# Patient Record
Sex: Female | Born: 1985 | Race: Black or African American | Hispanic: No | Marital: Single | Smoking: Never smoker
Health system: Southern US, Community
[De-identification: ages and names within clinical notes are randomized; demographics above are authoritative.]

## PROBLEM LIST (undated history)

## (undated) ENCOUNTER — Inpatient Hospital Stay (HOSPITAL_COMMUNITY): Payer: Self-pay

## (undated) DIAGNOSIS — N200 Calculus of kidney: Secondary | ICD-10-CM

## (undated) DIAGNOSIS — F329 Major depressive disorder, single episode, unspecified: Secondary | ICD-10-CM

## (undated) DIAGNOSIS — F32A Depression, unspecified: Secondary | ICD-10-CM

## (undated) HISTORY — DX: Calculus of kidney: N20.0

## (undated) HISTORY — DX: Depression, unspecified: F32.A

## (undated) HISTORY — DX: Major depressive disorder, single episode, unspecified: F32.9

## (undated) HISTORY — PX: OTHER SURGICAL HISTORY: SHX169

---

## 2004-05-14 HISTORY — PX: FRACTURE SURGERY: SHX138

## 2005-12-22 ENCOUNTER — Inpatient Hospital Stay (HOSPITAL_COMMUNITY): Admission: AD | Admit: 2005-12-22 | Discharge: 2005-12-22 | Payer: Self-pay | Admitting: Family Medicine

## 2005-12-25 ENCOUNTER — Inpatient Hospital Stay (HOSPITAL_COMMUNITY): Admission: AD | Admit: 2005-12-25 | Discharge: 2005-12-25 | Payer: Self-pay | Admitting: Family Medicine

## 2005-12-27 ENCOUNTER — Other Ambulatory Visit: Admission: RE | Admit: 2005-12-27 | Discharge: 2005-12-27 | Payer: Self-pay | Admitting: Obstetrics and Gynecology

## 2006-01-17 ENCOUNTER — Inpatient Hospital Stay (HOSPITAL_COMMUNITY): Admission: AD | Admit: 2006-01-17 | Discharge: 2006-01-18 | Payer: Self-pay | Admitting: Obstetrics and Gynecology

## 2006-05-14 HISTORY — PX: LITHOTRIPSY: SUR834

## 2007-12-23 ENCOUNTER — Ambulatory Visit: Payer: Self-pay | Admitting: Gastroenterology

## 2008-05-14 LAB — CONVERTED CEMR LAB: Pap Smear: NORMAL

## 2008-05-14 LAB — HM PAP SMEAR

## 2008-11-18 ENCOUNTER — Ambulatory Visit: Payer: Self-pay | Admitting: Family Medicine

## 2008-11-18 DIAGNOSIS — F329 Major depressive disorder, single episode, unspecified: Secondary | ICD-10-CM | POA: Insufficient documentation

## 2008-11-18 DIAGNOSIS — G43009 Migraine without aura, not intractable, without status migrainosus: Secondary | ICD-10-CM | POA: Insufficient documentation

## 2008-11-22 ENCOUNTER — Telehealth: Payer: Self-pay | Admitting: Family Medicine

## 2008-11-30 ENCOUNTER — Ambulatory Visit (HOSPITAL_COMMUNITY): Payer: Self-pay | Admitting: Psychiatry

## 2008-12-02 ENCOUNTER — Telehealth: Payer: Self-pay | Admitting: Family Medicine

## 2008-12-03 ENCOUNTER — Ambulatory Visit (HOSPITAL_COMMUNITY): Payer: Self-pay | Admitting: Psychiatry

## 2008-12-20 ENCOUNTER — Ambulatory Visit (HOSPITAL_COMMUNITY): Payer: Self-pay | Admitting: Psychiatry

## 2008-12-31 ENCOUNTER — Ambulatory Visit (HOSPITAL_COMMUNITY): Payer: Self-pay | Admitting: Psychiatry

## 2009-01-13 ENCOUNTER — Ambulatory Visit (HOSPITAL_COMMUNITY): Payer: Self-pay | Admitting: Psychiatry

## 2009-02-04 ENCOUNTER — Ambulatory Visit: Payer: Self-pay | Admitting: Gastroenterology

## 2009-03-03 ENCOUNTER — Inpatient Hospital Stay (HOSPITAL_COMMUNITY): Admission: AD | Admit: 2009-03-03 | Discharge: 2009-03-04 | Payer: Self-pay | Admitting: Obstetrics & Gynecology

## 2009-03-06 ENCOUNTER — Ambulatory Visit (HOSPITAL_COMMUNITY): Admission: AD | Admit: 2009-03-06 | Discharge: 2009-03-06 | Payer: Self-pay | Admitting: Obstetrics & Gynecology

## 2009-03-10 ENCOUNTER — Ambulatory Visit (HOSPITAL_COMMUNITY): Admission: RE | Admit: 2009-03-10 | Discharge: 2009-03-10 | Payer: Self-pay | Admitting: Obstetrics & Gynecology

## 2009-04-04 ENCOUNTER — Ambulatory Visit (HOSPITAL_COMMUNITY): Admission: RE | Admit: 2009-04-04 | Discharge: 2009-04-04 | Payer: Self-pay | Admitting: Obstetrics and Gynecology

## 2009-07-09 ENCOUNTER — Inpatient Hospital Stay (HOSPITAL_COMMUNITY): Admission: AD | Admit: 2009-07-09 | Discharge: 2009-07-10 | Payer: Self-pay | Admitting: Obstetrics and Gynecology

## 2009-10-19 ENCOUNTER — Inpatient Hospital Stay (HOSPITAL_COMMUNITY): Admission: AD | Admit: 2009-10-19 | Discharge: 2009-10-19 | Payer: Self-pay | Admitting: Obstetrics and Gynecology

## 2009-11-03 ENCOUNTER — Inpatient Hospital Stay (HOSPITAL_COMMUNITY): Admission: AD | Admit: 2009-11-03 | Discharge: 2009-11-06 | Payer: Self-pay | Admitting: Obstetrics and Gynecology

## 2009-11-06 ENCOUNTER — Encounter: Admission: RE | Admit: 2009-11-06 | Discharge: 2009-12-06 | Payer: Self-pay | Admitting: Obstetrics and Gynecology

## 2010-04-12 ENCOUNTER — Ambulatory Visit: Payer: Self-pay | Admitting: Family

## 2010-04-12 DIAGNOSIS — IMO0002 Reserved for concepts with insufficient information to code with codable children: Secondary | ICD-10-CM | POA: Insufficient documentation

## 2010-04-12 DIAGNOSIS — K439 Ventral hernia without obstruction or gangrene: Secondary | ICD-10-CM | POA: Insufficient documentation

## 2010-04-14 ENCOUNTER — Telehealth: Payer: Self-pay | Admitting: Family

## 2010-04-18 ENCOUNTER — Ambulatory Visit (HOSPITAL_COMMUNITY)
Admission: RE | Admit: 2010-04-18 | Discharge: 2010-04-18 | Payer: Self-pay | Source: Home / Self Care | Admitting: Obstetrics and Gynecology

## 2010-04-24 ENCOUNTER — Telehealth: Payer: Self-pay | Admitting: Family

## 2010-04-25 ENCOUNTER — Ambulatory Visit: Payer: Self-pay | Admitting: Family

## 2010-04-25 DIAGNOSIS — R635 Abnormal weight gain: Secondary | ICD-10-CM | POA: Insufficient documentation

## 2010-04-26 ENCOUNTER — Encounter: Payer: Self-pay | Admitting: Family

## 2010-05-03 ENCOUNTER — Ambulatory Visit: Payer: Self-pay | Admitting: Family

## 2010-05-05 ENCOUNTER — Telehealth: Payer: Self-pay | Admitting: Family

## 2010-05-09 ENCOUNTER — Ambulatory Visit: Payer: Self-pay | Admitting: Family

## 2010-05-19 ENCOUNTER — Ambulatory Visit: Admit: 2010-05-19 | Payer: Self-pay | Admitting: Family

## 2010-05-29 ENCOUNTER — Ambulatory Visit: Admit: 2010-05-29 | Payer: Self-pay | Admitting: Psychology

## 2010-05-30 ENCOUNTER — Ambulatory Visit: Admit: 2010-05-30 | Payer: Self-pay | Admitting: Family

## 2010-05-30 DIAGNOSIS — Z0289 Encounter for other administrative examinations: Secondary | ICD-10-CM

## 2010-06-06 ENCOUNTER — Ambulatory Visit: Admit: 2010-06-06 | Payer: Self-pay | Admitting: Family

## 2010-06-06 ENCOUNTER — Telehealth: Payer: Self-pay | Admitting: Family

## 2010-06-06 DIAGNOSIS — Z0289 Encounter for other administrative examinations: Secondary | ICD-10-CM

## 2010-06-09 ENCOUNTER — Ambulatory Visit: Admit: 2010-06-09 | Payer: Self-pay | Admitting: Family

## 2010-06-09 DIAGNOSIS — Z0289 Encounter for other administrative examinations: Secondary | ICD-10-CM

## 2010-06-13 NOTE — Assessment & Plan Note (Signed)
Summary: TO EST/ ABD PAIN/HEA--Rm 5   Vital Signs:  Patient profile:   25 year old female Menstrual status:  regular LMP:     03/14/2010 Height:      63.4 inches Weight:      215.75 pounds BMI:     37.87 Temp:     98.2 degrees F oral Pulse rate:   84 / minute Pulse rhythm:   regular Resp:     16 per minute BP sitting:   110 / 76  (right arm) Cuff size:   large  Vitals Entered By: Mervin Kung CMA Duncan Dull) (April 12, 2010 8:16 AM) CC: New pt to establish care.  Has had intermittent right side abdominal pain x 2 months. Noticed a lump on the right side of her abdomen 3 weeks ago.  Has history of depression and would like to restart meds., Depression Is Patient Diabetic? No Pain Assessment Patient in pain? no      Comments Pt stopped Prozac when pregnant. Would like to start med for depression. Nicki Guadalajara Fergerson CMA Duncan Dull)  April 12, 2010 8:24 AM  LMP (date): 03/14/2010     Enter LMP: 03/14/2010 Last PAP Result normal   Primary Care Provider:  Lemont Fillers FNP  CC:  New pt to establish care.  Has had intermittent right side abdominal pain x 2 months. Noticed a lump on the right side of her abdomen 3 weeks ago.  Has history of depression and would like to restart meds. and Depression.  History of Present Illness: Ms. Glidden is a 25 year old female who presents today to establish care.  She was previously followed by Dr. Linford Arnold at the [place] site. She has two concerns today:  1) Lump on right side of stomach for a few weeks.  More noticable with sitting or standing. + nausea, no vomitting.   Intermittent cramping pain "all over her stomch."  Attributes these symptoms to constipation.  Only has BM once every few weeks.  Has tried dulcolax, benefiber, increasing water without improvement.  Appetite is fair.  2) Depression-  Did not have any improvement with prozac.  Saw psychiatrist Junius Argyle) and was tried on a new medication but she stopped due pregnancy.   Now has a 15 month old daughter who is bottle fed. Feels that her symtpoms have worsened since the birth of her daughter.  + anxiety- sometimes can't breath, heart racing.  About twice a week.  +Symptoms are associated with Crying spells, but not associated with suicide ideation.  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 Denies suicidal ideation.  Has been told by her co-workers that her appearance "looks bad."  Has also overheard her supervisor saying that she "needs to keep her problems at home."  Reports that her performance at her call center is down, " I just sit there."   Depression History:      Positive alarm features for depression include significant weight gain, insomnia, fatigue (loss of energy), and impaired concentration (indecisiveness).  However, she denies hypersomnia.  The patient denies symptoms of a manic disorder including excessive buying sprees and excessive sexual indiscretions.        Comments:  depression worse since birth of her 48 month old child.  Falls asleep, then wakes back up.  .   Allergies (verified): No Known Drug Allergies  Past History:  Past Medical History: Hx kidney stones Depression [id number]  Family History: MGF with alcoholism, Paunts with alcoholism Dad-  living, alive and well Mom- living, alive and well Maternal GM- Breast cancer diagnosed in 25's, died at age 12. Aunt (maternal) 4 with breast cancer- 3 deceased, on living (one was diagnosed at age 26 1 Brother- alive and well (13 years younger) 2 sisters (one older, one younger) alive an well 1 daughter born 6/11- healthy  Social History: Science writer.  Single.  Broke up with daughter's dad. Never Smoked Alcohol use-no Drug use-no Regular exercise-yes  Review of Systems       Constitutional: Denies Fever ENT:  Denies nasal congestion or sore throat. Resp: Denies cough CV:  Denies Chest Pain GI:  mild  nausea no vomitting GU: Denies dysuria Lymphatic:  Denies lymphadenopathy Musculoskeletal:  + back pain, sees a chiropracter Skin:  Denies Rashes Psychiatric: see HPI Neuro: Denies numbness     Physical Exam  General:  Overweight AA female, awake, alert, tearful at times. Head:  Normocephalic and atraumatic without obvious abnormalities. No apparent alopecia or balding. Eyes:  PERRLA, sclera clear Ears:  External ear exam shows no significant lesions or deformities.  Otoscopic examination reveals clear canals, tympanic membranes are intact bilaterally without bulging, retraction, inflammation or discharge. Hearing is grossly normal bilaterally. Mouth:  Oral mucosa and oropharynx without lesions or exudates.  Teeth in good repair. Neck:  No deformities, masses, or tenderness noted. Lungs:  Normal respiratory effort, chest expands symmetrically. Lungs are clear to auscultation, no crackles or wheezes. Heart:  Normal rate and regular rhythm. S1 and S2 normal without gallop, murmur, click, rub or other extra sounds. Abdomen:  + protuberance noted on right abdominal wall beneath adipose tissue, easily reducible non-tender.  + hypoactive bowel sounds.  Abdomen is soft, no guarding.   Msk:  Bilateral UE/LE strength is 5/5 Neurologic:  No cranial nerve deficits noted. Station and gait are normal. Plantar reflexes are down-going bilaterally. DTRs are symmetrical throughout. Sensory, motor and coordinative functions appear intact. Psych:  Pleasant female, intermittenty tearful but appropriate.   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  Recommended that the patient start with 30 mg PO once daily and then increase to 60 mg once daily on the second week.  Patient was given rx for 30mg  and samples provided for cymbalta 60mg  04 2013 [id number] A #28.  Patient was counseled on the potential side effects  including risk of suicide ideation and instructed her to call us immediately if she develops worsening depression.  Pt verbalized understanding.  Pt is requesting that she be written out of work which I feel is appropriate at this time until her medication starts to help her.  Recommended f/u in 2 weeks so that we can re-assess her.  25 minutes spent with patient.  Greater than 50% of this time was spent counseling patient on her depression.   Problem # 2:  VENTRAL HERNIA (ICD-553.20) Assessment: New  5 month post partum female presents with reducible ventral hernia.  Will refer to surgeon for further evaluation.    Orders: Surgical Referral (Surgery)  Complete Medication List: 1)  Depo-provera 400 Mg/ml Susp (Medroxyprogesterone acetate) .... Per gyn 2)  Miralax Powd (Polyethylene glycol 3350) .Marland KitchenMarland KitchenMarland Kitchen 17 grams by mouth once daily in 8 ounces of water 3)  Cymbalta 30 Mg Cpep (Duloxetine hcl) .... One tablet by mouth once daily for 1 week, then increase to 60 mg by mouth daily  Patient Instructions: 1)  Cymbalta it will likely take several weeks before you will notice improvement. 2)  Side effects of this medicine may include drowsiness or nausea.  If this becomes an issue for you call for further instructions. 3)  Very rarely people may develop suicidal thoughts when taking these types of medicines- should this happen to you, discontinue medication and go directly to the emergency room. 4)  Please arrange a follow up appointment in 2 weeks. Prescriptions: CYMBALTA 60 MG CPEP (DULOXETINE HCL) one tablet by mouth once daily  #28 x 0   Entered and Authorized by:   Lemont Fillers FNP   Signed by:   Lemont Fillers FNP on 04/12/2010   Method used:   Samples Given   RxID:   [id number] CYMBALTA 30 MG CPEP (DULOXETINE HCL) one tablet by mouth once daily for 1 week, then increase to 60 mg by mouth daily  #7 x 0   Entered and Authorized by:   Lemont Fillers FNP   Signed by:    Lemont Fillers FNP on 04/12/2010   Method used:   Electronically to        [facility] [address] (retail)       [phone]       [place], [place]  [address]       Ph: [phone]       Fax: [phone]   RxID:   [id number]    Orders Added: 1)  Surgical Referral [Surgery] 2)  Est. Patient Level IV [09381]    Current Allergies (reviewed today): No known allergies     Preventive Care Screening  Pap Smear:    Date:  01/12/2010    Results:  normal

## 2010-06-13 NOTE — Letter (Signed)
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 [address]   [place], [place] [address]   Phone: [phone]  Fax: [phone]    April 12, 2010   Employee:  [name]    To Whom It May Concern:   For Medical reasons, please excuse the above named employee from work for the following dates:  Start:   04/12/10  End:   05/01/10  If you need additional information, please feel free to contact our office.         Sincerely,    Lemont Fillers FNP

## 2010-06-13 NOTE — Progress Notes (Signed)
Summary: Medical Leave paperwork release  Phone Note Outgoing Call   Call placed by: Mervin Kung, CMA (AAMA) Call placed to: Patient Summary of Call: Received Behavioral Health Clinician Statement from [facility] [employer]  to be completed for pt's leave of absence from work. Forms completed, pt notified and she will stop by office today to sign Medical Release for paperwork. Nicki Guadalajara Fergerson CMA Duncan Dull)  April 14, 2010 11:43 AM   Follow-up for Phone Call        Spoke to pt, states she did not have transportation on Friday but plans to come today to sign release. Nicki Guadalajara Fergerson CMA Duncan Dull)  April 17, 2010 9:18 AM   Additional Follow-up for Phone Call Additional follow up Details #1::        Pt signed record release. Form and notes faxed to [employer] at [phone]. Nicki Guadalajara Fergerson CMA Duncan Dull)  April 17, 2010 11:51 AM

## 2010-06-15 NOTE — Progress Notes (Signed)
Summary: r/s appt  Phone Note Outgoing Call   Call placed by: Vilma Prader [facility]) Call placed to: Patient Summary of Call: Left message for pt to return my call.  Pt missed appt this a.m. and needs to r/s.  Nicki Guadalajara Fergerson CMA Duncan Dull)  June 06, 2010 11:40 AM   Follow-up for Phone Call        Left message for pt to return my call. Nicki Guadalajara Fergerson CMA Duncan Dull)  June 07, 2010 8:44 AM   Pt returned my call and rescheduled for Friday 06/09/10 @ 8:15am. Mervin Kung CMA (AAMA)  June 07, 2010 4:24 PM

## 2010-06-15 NOTE — Assessment & Plan Note (Signed)
Summary: f/u to assess return to work / tf,cma--Rm 5   Vital Signs:  Patient profile:   25 year old female Menstrual status:  regular Height:      63.4 inches Weight:      218 pounds BMI:     38.27 Temp:     98.3 degrees F oral Pulse rate:   84 / minute Pulse rhythm:   regular Resp:     16 per minute BP sitting:   110 / 78  (right arm) Cuff size:   large  Vitals Entered By: Mervin Kung CMA Duncan Dull) (April 25, 2010 1:59 PM) CC: Pt here for reassessment of release to work. Is Patient Diabetic? No Comments Pt agrees all med doses and directions are correct. Nicki Guadalajara Fergerson CMA Duncan Dull)  April 25, 2010 2:05 PM    Primary Care Provider:  Lemont Fillers FNP  CC:  Pt here for reassessment of release to work..  History of Present Illness: Ms. [name] is a 25 year old female who presents today for follow up of her depression and for evaluation for return to work.    1) Depression- tolerating cymbalta without side effects except mild HA.  Now up to 60 mg.  Doesn' feel any better yet  in regards to her depression. Anxiety was a little better last week. Only had one anxiety episode last  week.  Continues having daily crying spells.  Not sleeping well.  Denies suicide ideation.  Appettite is poor.  Notes continued weight gain.  Has gained close to 100 pounds since 2007.     Allergies (verified): No Known Drug Allergies  Past History:  Past Medical History: Last updated: 04/12/2010 Hx kidney stones Depression [id number]  Past Surgical History: Last updated: 11/18/2008 Lithotripsy 2008 ankle fracture surg 2006  Review of Systems       see HPI  Physical Exam  General:  Well-developed,well-nourished,in no acute distress; alert,appropriate and cooperative throughout examination Psych:  Oriented X3, memory intact for recent and remote, normally interactive, good eye contact, not anxious appearing, and flat affect.     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  Plan to continue cymbalta 60mg .  Not yet medically ready for return to work.  Pt to f/u in 2 weeks for re-evaluation.  If no improvement at that time, will consider Viibryd vs. psychiatric referral.  15 minutes spent with patient.  Greater than 50% of this time was spent counseling patient on her depression.   Problem # 2:  WEIGHT GAIN (ICD-783.1) Assessment: Unchanged Will check baseline TSH. Orders: T-TSH ([id number])  Complete Medication List: 1)  Depo-provera 400 Mg/ml Susp (Medroxyprogesterone acetate) .... Per gyn 2)  Miralax Powd (Polyethylene glycol 3350) .Marland KitchenMarland KitchenMarland Kitchen 17 grams by mouth once daily in 8 ounces of water 3)  Cymbalta 60 Mg Cpep (Duloxetine hcl) .... Take 1 capsule by mouth once a day.  Patient Instructions: 1)  Please follow up in 2 weeks. 2)  Call sooner if you develop worsening depression.   Orders Added: 1)  T-TSH [[id number]] 2)  Est. Patient Level III [57846]    Current Allergies (reviewed today): No known allergies

## 2010-06-15 NOTE — Progress Notes (Signed)
Summary: return to work  Phone Note Other Incoming   Caller: Victorino Dike, LCSW  @ [employer] of Call: Received another Behavioral health Clinician Statement from [place] inquiring if 05/01/10 will be a full duty return to work date, if hernia surgery was scheduled and if so, date?  Spoke to pt and scheduled f/u for 04/25/10 @ 2:15 pm to assess return to work date. Left message for Victorino Dike to return my call. Pt will need surgical return to work date from Careers adviser. Nicki Guadalajara Fergerson CMA Duncan Dull)  April 24, 2010 5:01 PM   Follow-up for Phone Call        Left message for Victorino Dike at [employer] to return my call. Return to work date has been extended. Letter was given to pt. at [facility]. Do we need to complete forms each time the return to work date changes? Nicki Guadalajara Fergerson CMA Duncan Dull)  April 26, 2010 8:14 AM   Additional Follow-up for Phone Call Additional follow up Details #1::        Victorino Dike left voice message stating they need updated paper work / forms if return to work date has been extended. Left detailed message on voicemail that surgery date has not been scheduled yet but return to work from surgery will need to come from Careers adviser. Forms forwarded to Provider for completion. Nicki Guadalajara Fergerson CMA Duncan Dull)  April 26, 2010 9:32 AM     Additional Follow-up for Phone Call Additional follow up Details #2::    Forms completed and faxed to [phone] on 04/26/10 @ 2:35pm.  Mervin Kung CMA Duncan Dull)  April 27, 2010 9:19 AM

## 2010-06-15 NOTE — Progress Notes (Signed)
Summary: return to work status  Phone Note Other Incoming   Caller: Victorino Dike @ [employer]) [phone] Summary of Call: Received voice message from Victorino Dike wanting to confirm pt's full return to work date as 05/10/10. She advised if return to work date needs to be extended we will need to complete the additional physician's statement she faxed to [place]. She states she will need to receive paperwork by 05/09/10. I returned her call and left message on her voicemail that pt has f/u on 05/09/10 in the afternoon and the paperwork would probably not be faxed back until 05/10/10 if return to work needs to be extended. I advised Victorino Dike to call me with additional questions or directions.  Nicki Guadalajara Fergerson CMA Duncan Dull)  May 05, 2010 11:10 AM      Appended Document: return to work status Completed paperwork faxed to [phone].

## 2010-06-15 NOTE — Assessment & Plan Note (Signed)
Summary: 2 week follow up/mhf--Rm 2   Vital Signs:  Patient profile:   25 year old female Menstrual status:  regular Height:      63.4 inches Weight:      213.25 pounds BMI:     37.44 Temp:     98.3 degrees F oral Pulse rate:   84 / minute Pulse rhythm:   regular Resp:     16 per minute BP sitting:   110 / 70  (right arm) Cuff size:   large  Vitals Entered By: Mervin Kung CMA Duncan Dull) (May 09, 2010 3:08 PM) CC: Pt here for follow up. States she stopped Cymbalta Friday due to headaches and feeling jittery., Depression Is Patient Diabetic? No Pain Assessment Patient in pain? no        Primary Care Provider:  Lemont Fillers FNP  CC:  Pt here for follow up. States she stopped Cymbalta Friday due to headaches and feeling jittery. and Depression.  History of Present Illness: Patient presents today for follow up of her depression.  Stopped her Cymbalta on Friday 12/23.  Notes that she developed some itching and headaches last week- discontinued Cymbalta.  No panic attacks in last 1 week.  Anxiety is improved.  Still having difficulty focusing.  Her sister has moved in to help her with her child.  Daily crying spells.    Depression History:      Positive alarm features for depression include insomnia, fatigue (loss of energy), and impaired concentration (indecisiveness).  However, she denies hypersomnia and recurrent thoughts of death or suicide.  The patient denies symptoms of a manic disorder including persistently & abnormally elevated mood, less need for sleep, talkative or feels need to keep talking, excessive buying sprees, and excessive sexual indiscretions.        Comments:  Still not.   Allergies (verified): No Known Drug Allergies  Past History:  Past Medical History: Last updated: 04/12/2010 Hx kidney stones Depression [id number]  Past Surgical History: Last updated: 11/18/2008 Lithotripsy 2008 ankle fracture surg 2006  Review of Systems   see HPI  Physical Exam  General:  Well-developed,well-nourished,in no acute distress; alert,appropriate and cooperative throughout examination Head:  Normocephalic and atraumatic without obvious abnormalities. No apparent alopecia or balding. Psych:  Oriented X3 and memory intact for recent and remote.  Flat affect but answers questions approprately.  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  She noted some improvement in her anxiety, but depression was unchanged.  Will give her a trial of Viibryd. (sample starter pack of viibryd given to patient 04/2011)  Will also refer her to psychiatry and a therapist.  I do not feel that she is well enough to return to work at this time.  FMLA paperwork filled.  15 minutes spent with patient.  Greater than 50% of this time was spent counseling patient on her depression.   Orders: Form Completion [phone]) Psychology Referral (Psychology) Psychiatric Referral (Psych)  Complete Medication List: 1)  Depo-provera 400 Mg/ml Susp (Medroxyprogesterone acetate) .... Per gyn 2)  Miralax Powd (Polyethylene glycol 3350) .Marland KitchenMarland KitchenMarland Kitchen 17 grams by mouth once daily in 8 ounces of water 3)  Viibryd 40 Mg Tabs (Vilazodone hcl) .... Take as directed  Patient Instructions: 1)  You will be contacted about your referral to psychiatry.   2)  Please follow up in 3 weeks- sooner if worsening depression.    Orders  Added: 1)  Form Completion [99080] 2)  Psychology Referral [Psychology] 3)  Psychiatric Referral [Psych] 4)  Est. Patient Level III [04540]    Current Allergies (reviewed today): No known allergies

## 2010-06-15 NOTE — Letter (Signed)
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 [address]   [place], [place] [address]   Phone: [phone]  Fax: [phone]    April 25, 2010   Employee:  [name]    To Whom It May Concern:   For Medical reasons, please excuse the above named employee from work for the following dates:  Start:   12/13  End:   12/28 if pt is medically cleared for return to work.  If you need additional information, please feel free to contact our office.         Sincerely,    Lemont Fillers FNP

## 2010-06-15 NOTE — Letter (Signed)
Summary: Disability Form/[employer]  Disability Form/[employer]   Imported By: Lanelle Bal 05/18/2010 13:02:48  _____________________________________________________________________  External Attachment:    Type:   Image     Comment:   External Document

## 2010-06-23 ENCOUNTER — Ambulatory Visit (INDEPENDENT_AMBULATORY_CARE_PROVIDER_SITE_OTHER): Payer: Private Health Insurance - Indemnity | Admitting: Family

## 2010-06-23 ENCOUNTER — Encounter: Payer: Self-pay | Admitting: Family

## 2010-06-23 DIAGNOSIS — F3289 Other specified depressive episodes: Secondary | ICD-10-CM

## 2010-06-23 DIAGNOSIS — F329 Major depressive disorder, single episode, unspecified: Secondary | ICD-10-CM

## 2010-06-27 ENCOUNTER — Telehealth: Payer: Self-pay | Admitting: Family

## 2010-06-29 NOTE — Assessment & Plan Note (Signed)
Summary: f/u depression--rm 5   Vital Signs:  Patient profile:   25 year old female Menstrual status:  regular Height:      63.4 inches Weight:      218 pounds BMI:     38.27 Temp:     98.2 degrees F oral Pulse rate:   78 / minute Pulse rhythm:   regular Resp:     16 per minute BP sitting:   118 / 78  (right arm) Cuff size:   large  Vitals Entered By: Mervin Kung CMA [name]) (June 23, 2010 2:20 PM) CC: Pt here for follow up of depression. Is Patient Diabetic? No Pain Assessment Patient in pain? no      Comments Pt completed samples of Viibryd.  Nicki Guadalajara Fergerson CMA (AAMA)  June 23, 2010 2:25 PM    Primary Care Provider:  Lemont Fillers FNP  CC:  Pt here for follow up of depression.Marland Kitchen  History of Present Illness: [name] is a 25 year old female who presents today for followup of her depression. Last visit she was started on Viibryd.  She reports that she tolerated this medication without difficulty. She denied any associated GI side effects. She reported that after a few weeks she noted a significant improvement in her depression.  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 As a result, she reports that she has not had a paycheck in over one month. Due to lack of funds she was unable to return to the office or to refill her medications. She has been out of medications for approximately 2 weeks. She is already back to work.  Patient denies suicide ideation. She reports that overall she is better able to handle her ADLs. Since she has run out of her medication, she notes that she has had recurrent bouts of tearfulness.  Allergies (verified): No Known Drug Allergies  Past History:  Past Medical History: Last updated: 04/12/2010 Hx kidney stones Depression [id number]  Past Surgical  History: Last updated: 11/18/2008 Lithotripsy 2008 ankle fracture surg 2006  Review of Systems       see history of present illness  Physical Exam  General:  Well-developed,well-nourished,in no acute distress; alert,appropriate and cooperative throughout examination Psych:  Cognition and judgment appear intact. Alert and cooperative with normal attention span and concentration. No apparent delusions, illusions, hallucinations   Impression & Recommendations:  Problem # 1:  DEPRESSION (ICD-311) Assessment Improved Overall her depression appears improved. I suspect that she was doing even better several weeks ago before her medications ran out. She reports that she did not call to make an appointment with psychiatry as instructed. She also reports that she has not heard from behavioral health in regards to her therapy referral. At this time will plan to resume medication as below. Will have referral coordinator check into scheduling status of her therapy appointment. Plan to hold off on psychiatry referral of this time,  as she seems to have improved with Viibyd.  The clearance letter was written for her to return to work. The patient is to follow up in one month. 20 minutes were spent today with the patient. Greater than 50% of this time was spent counseling the patient on her depression.  Her updated medication list for this problem includes:    Viibryd 40 Mg Tabs (Vilazodone hcl) .Marland KitchenMarland KitchenMarland KitchenMarland Kitchen  One tablet by mouth daily  Complete Medication List: 1)  Depo-provera 400 Mg/ml Susp (Medroxyprogesterone acetate) .... Per gyn 2)  Viibryd 40 Mg Tabs (Vilazodone hcl) .... One tablet by mouth daily  Patient Instructions: 1)  You will be contacted about your referral to a therapist. 2)  Follow up in 1 month, sooner if problems or concerns.  Prescriptions: VIIBRYD 40 MG TABS (VILAZODONE HCL) one tablet by mouth daily  #30 x 2   Entered and Authorized by:   Lemont Fillers FNP   Signed by:   Lemont Fillers FNP on 06/23/2010   Method used:   [facility]        [phone][facility] (retail)       [address]       [place], [place]  [address]       Ph: [phone]       Fax: [phone]   RxID:   [id number]    Orders Added: 1)  Est. Patient Level III [44034]    Current Allergies (reviewed today): No known allergies

## 2010-06-29 NOTE — Letter (Signed)
Summary: Generic Letter   at [facility]  [address]   [place], [place] [address]   Phone: [phone]  Fax: [phone]    06/23/2010  To whom it may concern,  I am writing in reference to Ms.  [name] Case # [id number].  I evaluated Ms. [name] today and have cleared her medically to return to work.  Please contact our office should you have any further questions or concerns.     Sincerely,   Sandford Craze FNP

## 2010-07-03 ENCOUNTER — Ambulatory Visit: Payer: Private Health Insurance - Indemnity | Admitting: Licensed Clinical Social Worker

## 2010-07-05 ENCOUNTER — Telehealth: Payer: Self-pay | Admitting: Family

## 2010-07-05 NOTE — Progress Notes (Signed)
Summary: question about rx  Phone Note Call from Patient Call back at Home Phone [phone]   Caller: Patient Call For: nurse Reason for Call: Talk to Nurse Summary of Call: pt called and stated that rx for depression is too expensive. pt was wondering if Efraim Kaufmann could call in something else. please assist. Initial call taken by: Elba Barman,  June 27, 2010 2:37 PM  Follow-up for Phone Call        Pt tells me that she cannot afford Viibryd.  Tried Prozac once in the past but stopped after 1 week due to pregnancy.  Will give trial fo Citalopram.  Pt instructed to call if her depression symptoms worsen.  Otherwise f/u in 1 month.  Denies current suicide ideation.   Follow-up by: Lemont Fillers FNP,  June 27, 2010 3:51 PM    New/Updated Medications: CITALOPRAM HYDROBROMIDE 20 MG TABS (CITALOPRAM HYDROBROMIDE) one tablet by mouth daily

## 2010-07-07 ENCOUNTER — Ambulatory Visit: Payer: Private Health Insurance - Indemnity | Admitting: Family

## 2010-07-10 ENCOUNTER — Encounter: Payer: Self-pay | Admitting: Family

## 2010-07-10 ENCOUNTER — Ambulatory Visit (INDEPENDENT_AMBULATORY_CARE_PROVIDER_SITE_OTHER): Payer: Private Health Insurance - Indemnity | Admitting: Family

## 2010-07-10 DIAGNOSIS — F329 Major depressive disorder, single episode, unspecified: Secondary | ICD-10-CM

## 2010-07-11 ENCOUNTER — Telehealth: Payer: Self-pay | Admitting: Family

## 2010-07-11 NOTE — Progress Notes (Signed)
Summary: drugstore didn't get rx  Phone Note Call from Patient Call back at [phone]   Caller: Patient Call For: Lemont Fillers FNP Summary of Call: drugstore says that they didnt get the rx. please resend.  Initial call taken by: Elba Barman,  July 05, 2010 4:09 PM  Follow-up for Phone Call        [facility] to [place] [facility]. Pt has been notified. Nicki Guadalajara Fergerson CMA (AAMA)  July 05, 2010 4:22 PM     Prescriptions: CITALOPRAM HYDROBROMIDE 20 MG TABS (CITALOPRAM HYDROBROMIDE) one tablet by mouth daily  #30 x 0   Entered by:   Mervin Kung CMA (AAMA)   Authorized by:   Lemont Fillers FNP   Signed by:   Mervin Kung CMA (AAMA) on 07/05/2010   Method used:   Telephoned to ...       [facility] [phone] ([facility]ail)       [address]       [place], [place]  [address]       Ph: [phone]       Fax: [phone]   RxID:   [id number]

## 2010-07-20 NOTE — Letter (Signed)
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 [address]   [place], [place] [address]   Phone: [phone]  Fax: [phone]    July 10, 2010   Employee:  [name]    To Whom It May Concern:   For Medical reasons, please excuse the above named employee from work for the following dates:  Start:   07/10/10  End:   08/08/10  If you need additional information, please feel free to contact our office.         Sincerely,    Lemont Fillers FNP

## 2010-07-20 NOTE — Progress Notes (Addendum)
Summary: short term disability forms  Phone Note Other Incoming   Summary of Call: Received fax from [facility] with Monia Pouch re: pt request for short term disability. Forms initiated and forwarded to Provider for completion and signature. Nicki Guadalajara Fergerson CMA Duncan Dull)  July 11, 2010 11:46 AM   Follow-up for Phone Call        form completed. Follow-up by: Lemont Fillers FNP,  July 12, 2010 8:39 AM     Appended Document: short term disability forms Forms were faxed to [phone] on 07/13/10 @ 1:45pm.  Appended Document: short term disability forms Forwarded forms to medical records for scanning today.

## 2010-07-20 NOTE — Miscellaneous (Signed)
Summary: Controlled Substances Contract  Controlled Substances Contract   Imported By: Maryln Gottron 07/14/2010 14:32:06  _____________________________________________________________________  External Attachment:    Type:   Image     Comment:   External Document

## 2010-07-20 NOTE — Assessment & Plan Note (Signed)
Summary: wants to talk to [facility] about meds/ss--rm 4   Vital Signs:  Patient profile:   25 year old female Menstrual status:  regular Height:      63.4 inches Weight:      218 pounds BMI:     38.27 Temp:     98.0 degrees F oral Pulse rate:   78 / minute Pulse rhythm:   regular Resp:     16 per minute BP sitting:   118 / 70  (right arm) Cuff size:   large  Vitals Entered By: Mervin Kung CMA Duncan Dull) (July 10, 2010 8:20 AM) CC: Pt here for follow up, feels like depression is worse. Citalopram Rx did not get to the pharmacy until 2/22 and pt was without med., Depression Is Patient Diabetic? No Pain Assessment Patient in pain? no        Primary Care Provider:  Lemont Fillers FNP  CC:  Pt here for follow up, feels like depression is worse. Citalopram Rx did not get to the pharmacy until 2/22 and pt was without med., and Depression.  History of Present Illness: Ms.  Macias is a 25 year old female who presents today for follow up of her depression.  Pt was off of meds for 3-4 weeks.   Couldn't afford Viibryd and pharmacy did not have citalopram rx.  She did not call us for several weeks to let us know.  Now on citalopram for 4-5 days.  Feels like her depression is worse.  Feels "down." Doesn't want to leave the house or get out of bed.  "Cant' concentrate on anything."  Trouble meeting metrics at her job.  Feels anxious and nervous all of the time.  Has apt on 3/9 with therapist.   Depression History:      Positive alarm features for depression include insomnia and fatigue (loss of energy).         Allergies (verified): No Known Drug Allergies  Past History:  Past Medical History: Last updated: 04/12/2010 Hx kidney stones Depression [id number]  Past Surgical History: Last updated: 11/18/2008 Lithotripsy 2008 ankle fracture surg 2006  Review of Systems       see HPI  Physical Exam  General:  Awake, alert, NAD Psych:  Oriented X3, flat affect, and  subdued.     Impression & Recommendations:  Problem # 1:  DEPRESSION (ICD-311) Assessment Deteriorated 15 minutes were spent with patient.  Greater than 50% of this time was spent counseling patient on her depression.  I suspect that she also has some associated anxiety.  Will add as needed Klonopin.  She never did call psychiatry to schedule an apt.  Recommended that she call [facility]- number provided so that she can establish with psychiatrist.  Pt agrees to do this.  A note was provided out of work.  Recommended that she keep her upcoming apt with the therapist.  Her updated medication list for this problem includes:    Citalopram Hydrobromide 20 Mg Tabs (Citalopram hydrobromide) ..... One tablet by mouth daily    Klonopin 0.5 Mg Tabs (Clonazepam) ..... One tablet by mouth 3 times daily as needed for anxiety  Complete Medication List: 1)  Depo-provera 400 Mg/ml Susp (Medroxyprogesterone acetate) .... Per gyn 2)  Citalopram Hydrobromide 20 Mg Tabs (Citalopram hydrobromide) .... One tablet by mouth daily 3)  Klonopin 0.5 Mg Tabs (Clonazepam) .... One tablet by mouth 3 times daily as needed for anxiety  Patient Instructions: 1)  Please call [facility] at  [phone] to schedule an appointment as soon as possible. 2)  Follow up in 1 month.  Prescriptions: KLONOPIN 0.5 MG TABS (CLONAZEPAM) one tablet by mouth 3 times daily as needed for anxiety  #45 x 0   Entered and Authorized by:   Lemont Fillers FNP   Signed by:   Lemont Fillers FNP on 07/10/2010   Method used:   Print then Give to Patient   RxID:   [id number]    Orders Added: 1)  Est. Patient Level III [62130]    Current Allergies (reviewed today): No known allergies

## 2010-07-21 ENCOUNTER — Ambulatory Visit: Payer: Private Health Insurance - Indemnity | Admitting: Licensed Clinical Social Worker

## 2010-07-30 LAB — CBC
HCT: 34.6 % — ABNORMAL LOW (ref 36.0–46.0)
MCH: 28.9 pg (ref 26.0–34.0)
MCHC: 32.1 g/dL (ref 30.0–36.0)
MCV: 86.4 fL (ref 78.0–100.0)
Platelets: 202 10*3/uL (ref 150–400)
RBC: 3.44 MIL/uL — ABNORMAL LOW (ref 3.87–5.11)
RDW: 16.5 % — ABNORMAL HIGH (ref 11.5–15.5)
WBC: 17.5 10*3/uL — ABNORMAL HIGH (ref 4.0–10.5)

## 2010-07-30 LAB — ABO/RH: ABO/RH(D): O POS

## 2010-08-02 LAB — URINALYSIS, ROUTINE W REFLEX MICROSCOPIC
Glucose, UA: NEGATIVE mg/dL
Hgb urine dipstick: NEGATIVE
Nitrite: NEGATIVE
Specific Gravity, Urine: 1.02 (ref 1.005–1.030)
Urobilinogen, UA: 0.2 mg/dL (ref 0.0–1.0)

## 2010-08-04 ENCOUNTER — Ambulatory Visit (INDEPENDENT_AMBULATORY_CARE_PROVIDER_SITE_OTHER): Payer: Private Health Insurance - Indemnity | Admitting: Family

## 2010-08-04 ENCOUNTER — Encounter: Payer: Self-pay | Admitting: Family

## 2010-08-04 VITALS — BP 100/78 | HR 72 | Temp 98.7°F | Resp 16 | Ht 63.4 in | Wt 213.0 lb

## 2010-08-04 DIAGNOSIS — J029 Acute pharyngitis, unspecified: Secondary | ICD-10-CM

## 2010-08-04 DIAGNOSIS — F32A Depression, unspecified: Secondary | ICD-10-CM

## 2010-08-04 DIAGNOSIS — F329 Major depressive disorder, single episode, unspecified: Secondary | ICD-10-CM

## 2010-08-04 LAB — POCT RAPID STREP A (OFFICE): Rapid Strep A Screen: NEGATIVE

## 2010-08-04 MED ORDER — CITALOPRAM HYDROBROMIDE 20 MG PO TABS: 20.0000 mg | ORAL_TABLET | Freq: Every day | ORAL | Status: DC

## 2010-08-04 MED ORDER — DESVENLAFAXINE SUCCINATE ER 50 MG PO TB24: 50.0000 mg | ORAL_TABLET | Freq: Every day | ORAL | Status: DC

## 2010-08-04 NOTE — Assessment & Plan Note (Addendum)
Unchanged.  She has an upcoming appointment with [facility] to meet with a psychologist and ultimately psychiatrist.  I have encouraged her to keep this appointment.  In the meantime, will start Pristique in addition to her citalopram.  The citalopram seems to have helped her anxiety considerably.  I do not feel that she is ready to return to work at this time.  Plan f/u in 2 weeks, and hopefully at that time she will be stable for return to work.  25 minutes spent with the patient today.  Greater than 50% of this time was spent counseling patient on her depression.

## 2010-08-04 NOTE — Assessment & Plan Note (Signed)
Rapid strep is negative today.  Will plan conservative treatment- ibuprofen PRN, salt water gargles.  Pt instructed to call if her symptoms worsen or do not improve.

## 2010-08-04 NOTE — Progress Notes (Signed)
  Subjective:    Patient ID: [name], female    DOB: April 11, 1986, 25 y.o.   MRN: [id number]  HPI  Depression- taking citalopram.  No further panic attacks.  Daily tearfulness.  Sister is helping with household chores.  Has been on leave from her work for 2.5 months.  Her infant daughter is staying with her mother who is helping out.  Her FMLA is scheduled to end in the end of April.  Denies suicide ideation.  Basically "lays around in the bed."  Still not sleeping well at night.  [facility]- has appointment next week to establish care.    Sore throat- started Wednesday (3 days ago).  Initially sore on both sides, now just the left side is sore.  +laryngitis.  Denies fever.  Nausea, vomitting, diarrhea.  + ear pain on the left.   No past medical history on file.  History   Social History  . Marital Status: Single    Spouse Name: N/A    Number of Children: N/A  . Years of Education: N/A   Occupational History  . Not on file.   Social History Main Topics  . Smoking status: Never Smoker   . Smokeless tobacco: Never Used  . Alcohol Use: Not on file  . Drug Use: Not on file  . Sexually Active: Not on file   Other Topics Concern  . Not on file   Social History Narrative  . No narrative on file    No past surgical history on file.  No family history on file.  No Known Allergies  No current outpatient prescriptions on file prior to visit.    BP 100/78  Pulse 72  Temp(Src) 98.7 F (37.1 C) (Oral)  Resp 16  Ht 5' 3.4" (1.61 m)  Wt 213 lb (96.616 kg)  BMI 37.26 kg/m2      Review of Systems    see HPI Objective:   Physical Exam  Constitutional: She appears well-developed and well-nourished.  HENT:  Head: Normocephalic.  Right Ear: Tympanic membrane and external ear normal. No drainage.  Left Ear: Tympanic membrane and external ear normal.  Neck: No thyromegaly present.  Cardiovascular: S1 normal and S2 normal.   Pulmonary/Chest: Breath sounds normal.    Psychiatric:       Flat affect, though pleasant and appropriate. Not anxious appearing.  Not tearful today          Assessment & Plan:  Lot [id number] exp 07/13 #28 Pristique

## 2010-08-04 NOTE — Patient Instructions (Signed)
Call if you develop worsening depression symptoms. Please arrange a follow up visit in 2 weeks.

## 2010-08-08 ENCOUNTER — Encounter: Payer: Self-pay | Admitting: Family

## 2010-08-09 ENCOUNTER — Encounter: Payer: Self-pay | Admitting: Family

## 2010-08-09 ENCOUNTER — Ambulatory Visit (INDEPENDENT_AMBULATORY_CARE_PROVIDER_SITE_OTHER): Payer: Private Health Insurance - Indemnity | Admitting: Family

## 2010-08-09 ENCOUNTER — Telehealth: Payer: Self-pay | Admitting: *Deleted

## 2010-08-09 ENCOUNTER — Other Ambulatory Visit (HOSPITAL_COMMUNITY)
Admission: RE | Admit: 2010-08-09 | Discharge: 2010-08-09 | Disposition: A | Discharge status: Home / Self Care | Payer: Private Health Insurance - Indemnity | Source: Ambulatory Visit | Attending: Family | Admitting: Family

## 2010-08-09 DIAGNOSIS — N898 Other specified noninflammatory disorders of vagina: Secondary | ICD-10-CM

## 2010-08-09 DIAGNOSIS — Z113 Encounter for screening for infections with a predominantly sexual mode of transmission: Secondary | ICD-10-CM | POA: Insufficient documentation

## 2010-08-09 DIAGNOSIS — K439 Ventral hernia without obstruction or gangrene: Secondary | ICD-10-CM

## 2010-08-09 DIAGNOSIS — Z0000 Encounter for general adult medical examination without abnormal findings: Secondary | ICD-10-CM | POA: Insufficient documentation

## 2010-08-09 DIAGNOSIS — Z01419 Encounter for gynecological examination (general) (routine) without abnormal findings: Secondary | ICD-10-CM | POA: Insufficient documentation

## 2010-08-09 DIAGNOSIS — F329 Major depressive disorder, single episode, unspecified: Secondary | ICD-10-CM

## 2010-08-09 DIAGNOSIS — G43009 Migraine without aura, not intractable, without status migrainosus: Secondary | ICD-10-CM

## 2010-08-09 LAB — CBC WITH DIFFERENTIAL/PLATELET
Basophils Relative: 0 % (ref 0–1)
Eosinophils Absolute: 0.1 10*3/uL (ref 0.0–0.7)
Eosinophils Relative: 1 % (ref 0–5)
Monocytes Absolute: 0.3 10*3/uL (ref 0.1–1.0)
Monocytes Relative: 3 % (ref 3–12)
Platelets: 294 10*3/uL (ref 150–400)
RBC: 4.32 MIL/uL (ref 3.87–5.11)
WBC: 11.1 10*3/uL — ABNORMAL HIGH (ref 4.0–10.5)

## 2010-08-09 NOTE — Telephone Encounter (Signed)
Faxed Behavioral Health Statement to [place] at [facility] [phone].

## 2010-08-09 NOTE — Patient Instructions (Signed)
Please complete your lab work on the first floor today. We will contact you with the results of your labs and pap smear. Keep your upcoming appointment with [facility].

## 2010-08-09 NOTE — Progress Notes (Signed)
  Subjective:    Patient ID: [name], female    DOB: 01/12/1986, 25 y.o.   MRN: [id number]  HPI  Ms.  Macias is a 25 year old female who presents today for a complete physical.  Last pap was 05/2008.  Reports that her pap smears have "always been normal." Last tetanus was 4-5 years ago.  Not currently exercising.  Reports healthy diet.  Depression- mood is unchanges.  Feels irritable.  [facility]   Review of Systems  Constitutional: Negative for fever and unexpected weight change.  HENT: Negative for hearing loss and ear pain.   Eyes: Negative for pain and visual disturbance.  Respiratory: Negative for chest tightness and shortness of breath.   Cardiovascular: Negative for chest pain.  Gastrointestinal: Negative.   Genitourinary: Negative for urgency and difficulty urinating.  Musculoskeletal: Negative for joint swelling and arthralgias.  Skin: Negative for rash.  Neurological: Positive for headaches. Negative for weakness.       Occasional headaches  Hematological: Negative for adenopathy.  Psychiatric/Behavioral: Positive for decreased concentration. Negative for suicidal ideas and hallucinations. The patient is not nervous/anxious.          Objective:   Physical Exam  Constitutional: She is oriented to person, place, and time. She appears well-developed and well-nourished.  HENT:  Head: Normocephalic and atraumatic.  Eyes: Conjunctivae are normal. Pupils are equal, round, and reactive to light. No scleral icterus.  Neck: Neck supple. No thyromegaly present.  Cardiovascular: Normal rate and regular rhythm.   Pulmonary/Chest: Effort normal and breath sounds normal. She has no wheezes. She has no rales.  Abdominal: Soft. Bowel sounds are normal.  Genitourinary: No breast swelling, tenderness or discharge.       No external vaginal lesions. Normal cervix, pap performed.  + thin copious light yellow vaginal discharge Normal adnexa, uterine  size wnl  Musculoskeletal: Normal range of motion.  Lymphadenopathy:    She has no cervical adenopathy.    She has no axillary adenopathy.  Neurological: She is oriented to person, place, and time. She has normal strength. No cranial nerve deficit.  Reflex Scores:      Brachioradialis reflexes are 2+ on the right side and 2+ on the left side.      Patellar reflexes are 1+ on the right side and 1+ on the left side.      Achilles reflexes are 1+ on the right side and 1+ on the left side. Skin: Skin is warm and dry. No rash noted. No erythema.  Psychiatric:       Flat affect, not tearful.  A and O x 3          Assessment & Plan:

## 2010-08-10 ENCOUNTER — Telehealth: Payer: Self-pay | Admitting: Family

## 2010-08-10 DIAGNOSIS — A599 Trichomoniasis, unspecified: Secondary | ICD-10-CM | POA: Insufficient documentation

## 2010-08-10 LAB — LIPID PANEL
Cholesterol: 166 mg/dL (ref 0–200)
HDL: 37 mg/dL — ABNORMAL LOW (ref >39)
Triglycerides: 52 mg/dL (ref <150)

## 2010-08-10 LAB — HEPATIC FUNCTION PANEL
Alkaline Phosphatase: 87 U/L (ref 39–117)
Bilirubin, Direct: 0.1 mg/dL (ref 0.0–0.3)
Indirect Bilirubin: 0.2 mg/dL (ref 0.0–0.9)
Total Bilirubin: 0.3 mg/dL (ref 0.3–1.2)

## 2010-08-10 LAB — BASIC METABOLIC PANEL
CO2: 17 mEq/L — ABNORMAL LOW (ref 19–32)
Calcium: 9.2 mg/dL (ref 8.4–10.5)
Creat: 0.81 mg/dL (ref 0.40–1.20)

## 2010-08-10 LAB — WET PREP BY MOLECULAR PROBE
Candida species: NEGATIVE
Gardnerella vaginalis: NEGATIVE
Trichomonas vaginosis: POSITIVE — AB

## 2010-08-10 MED ORDER — METRONIDAZOLE 500 MG PO TABS: 500.0000 mg | ORAL_TABLET | Freq: Two times a day (BID) | ORAL | Status: AC

## 2010-08-10 NOTE — Telephone Encounter (Signed)
Spoke with patient.  Notified her that lab shows Trichomonas.  She tells me that she had this prior to her pregnancy.  Will treat with metronidazole x 7 days.  Pt instructed to notify all partners so that they can seek treatment as well.  Pt verbalizes understanding.

## 2010-08-11 NOTE — Assessment & Plan Note (Signed)
Immunizations reviewed and up to date. Pap performed today.  Pt counseled on diet, exercise, weight loss and SBE.

## 2010-08-11 NOTE — Progress Notes (Deleted)
  Subjective:    Patient ID: [name], female    DOB: [birth year], 25 y.o.   MRN: [id number]  HPI    Review of Systems     Objective:   Physical Exam        Assessment & Plan:

## 2010-08-14 ENCOUNTER — Encounter: Payer: Self-pay | Admitting: Family

## 2010-08-16 ENCOUNTER — Ambulatory Visit: Payer: Private Health Insurance - Indemnity | Admitting: Family

## 2010-08-17 LAB — WET PREP, GENITAL
Clue Cells Wet Prep HPF POC: NONE SEEN
Yeast Wet Prep HPF POC: NONE SEEN

## 2010-08-17 LAB — URINALYSIS, ROUTINE W REFLEX MICROSCOPIC
Hgb urine dipstick: NEGATIVE
Ketones, ur: 15 mg/dL — AB
Nitrite: NEGATIVE
Protein, ur: NEGATIVE mg/dL
Urobilinogen, UA: 0.2 mg/dL (ref 0.0–1.0)

## 2010-08-17 LAB — URINE MICROSCOPIC-ADD ON

## 2010-08-17 LAB — GC/CHLAMYDIA PROBE AMP, GENITAL: GC Probe Amp, Genital: NEGATIVE

## 2010-08-17 LAB — POCT PREGNANCY, URINE: Preg Test, Ur: POSITIVE

## 2010-08-20 ENCOUNTER — Telehealth: Payer: Self-pay | Admitting: Family

## 2010-08-22 ENCOUNTER — Telehealth: Payer: Self-pay | Admitting: Family

## 2010-08-22 ENCOUNTER — Ambulatory Visit: Payer: Private Health Insurance - Indemnity | Admitting: Family

## 2010-08-22 DIAGNOSIS — Z0289 Encounter for other administrative examinations: Secondary | ICD-10-CM

## 2010-08-22 NOTE — Telephone Encounter (Signed)
Please call pt and have her reschedule her apt.  I need to do some additional blood work at her follow up appointment to check her liver.

## 2010-08-23 NOTE — Telephone Encounter (Signed)
Call placed to patient at [phone], no answer. A detailed voice message was left informing patient per [facility] instructions. She was advised to call back to schedule follow up appointment with [facility]

## 2010-08-25 ENCOUNTER — Ambulatory Visit (INDEPENDENT_AMBULATORY_CARE_PROVIDER_SITE_OTHER): Payer: Private Health Insurance - Indemnity | Admitting: Family

## 2010-08-25 ENCOUNTER — Encounter: Payer: Self-pay | Admitting: Family

## 2010-08-25 VITALS — BP 112/74 | HR 66 | Temp 97.9°F | Resp 16 | Ht 63.39 in | Wt 218.1 lb

## 2010-08-25 DIAGNOSIS — A5901 Trichomonal vulvovaginitis: Secondary | ICD-10-CM

## 2010-08-25 DIAGNOSIS — A599 Trichomoniasis, unspecified: Secondary | ICD-10-CM

## 2010-08-25 DIAGNOSIS — R7989 Other specified abnormal findings of blood chemistry: Secondary | ICD-10-CM

## 2010-08-25 DIAGNOSIS — F32A Depression, unspecified: Secondary | ICD-10-CM

## 2010-08-25 DIAGNOSIS — F329 Major depressive disorder, single episode, unspecified: Secondary | ICD-10-CM

## 2010-08-25 LAB — HEPATIC FUNCTION PANEL
AST: 15 U/L (ref 0–37)
Albumin: 4.1 g/dL (ref 3.5–5.2)
Alkaline Phosphatase: 81 U/L (ref 39–117)
Total Protein: 7.1 g/dL (ref 6.0–8.3)

## 2010-08-25 MED ORDER — VILAZODONE HCL 40 MG PO TABS: 1.0000 | ORAL_TABLET | Freq: Every day | ORAL | Status: DC

## 2010-08-25 NOTE — Patient Instructions (Signed)
Start Viibryd starter pack as directed.  Stop Celexa and Pristiq. Please complete your blood work and ultrasound on the first floor.

## 2010-08-25 NOTE — Assessment & Plan Note (Signed)
Pt now tells me that she is able to afford viibryd.  This helped her the most.  Will stop celexa and pristiq and restart viibryd.  Starter pack was given to pt (sample)  She is to follow up in 3 weeks.

## 2010-08-25 NOTE — Assessment & Plan Note (Signed)
Pt completed metronidazole. Symptoms are resolved.

## 2010-08-25 NOTE — Progress Notes (Signed)
Subjective:    Patient ID: [name], female    DOB: [birth year], 25 y.o.   MRN: [id number]  HPI  Ms.  [name] is a 25 year old female who presents today for follow up of her depression.  Sister is staying with her.  Baby is with her mom.  Just laying around.  Taking celexa- not needing the klonopin. No improvement with the Pristiq.  Went to [employer] and they wanted her to pay full amount up front which she could not afford.  Now has apt with [facility].  She reports that she has 5.6 weeks left of "[employer] protection at her job."  She has trouble remembering to do things.  Her sister is keeping up with her bills and housework.    Elevated LFT's-  Denies ETOH use.  Trichomonas- completed metronidazole.  Discharge has resolved.     Review of Systems     Past Medical History  Diagnosis Date  . Depression   . Kidney stones     History   Social History  . Marital Status: Single    Spouse Name: N/A    Number of Children: 1  . Years of Education: N/A   Occupational History  . CUSTOMER SERVICE [employer]   Social History Main Topics  . Smoking status: Never Smoker   . Smokeless tobacco: Never Used  . Alcohol Use: No  . Drug Use: No  . Sexually Active: Not on file   Other Topics Concern  . Not on file   Social History Narrative   Regular exercise: no    Past Surgical History  Procedure Date  . Fracture surgery 2006    ankle  . Lithotripsy 2008    Family History  Problem Relation Age of Onset  . Cancer Maternal Aunt     breast; 3 aunts deceased & 1 living  . Cancer Maternal Grandmother 40    breast  . Alcohol abuse Maternal Grandfather   . Heart disease Paternal Grandfather     MI  . Heart disease Other     MI    No Known Allergies  Current Outpatient Prescriptions on File Prior to Visit  Medication Sig Dispense Refill  . clonazePAM (KLONOPIN) 0.5 MG tablet Take 0.5 mg by mouth 3 (three) times daily as needed.        . medroxyPROGESTERone  (DEPO-PROVERA) 400 MG/ML SUSP Inject 400 mg into the muscle once. Every 3 months.       . DISCONTD: citalopram (CELEXA) 20 MG tablet Take 1 tablet (20 mg total) by mouth daily.  30 tablet  2  . DISCONTD: desvenlafaxine (PRISTIQ) 50 MG 24 hr tablet Take 1 tablet (50 mg total) by mouth daily.  30 tablet  0    BP 112/74  Pulse 66  Temp(Src) 97.9 F (36.6 C) (Oral)  Resp 16  Ht 5' 3.39" (1.61 m)  Wt 218 lb 1.9 oz (98.939 kg)  BMI 38.16 kg/m2  LMP 08/16/2010    Objective:   Physical Exam  Constitutional: She appears well-developed and well-nourished.  HENT:  Head: Normocephalic and atraumatic.  Eyes: Conjunctivae are normal. Pupils are equal, round, and reactive to light.  Cardiovascular: Normal rate and regular rhythm.   Pulmonary/Chest: Effort normal and breath sounds normal.  Abdominal: Soft. Bowel sounds are normal. She exhibits no distension. There is no tenderness.       No HSM noted          Assessment & Plan:  viibryd  starter pack sample [id number] exp 07/2011

## 2010-08-25 NOTE — Assessment & Plan Note (Signed)
Etiology is unclear.  Will repeat LFT's today and will check acute hepatitis panel + abdominal ultrasound.  Need to rule out hepatitis or gallbladder disease.  Pt denies history of ETOH use.

## 2010-08-28 ENCOUNTER — Ambulatory Visit (HOSPITAL_BASED_OUTPATIENT_CLINIC_OR_DEPARTMENT_OTHER)
Admission: RE | Admit: 2010-08-28 | Discharge: 2010-08-28 | Disposition: A | Discharge status: Home / Self Care | Payer: Private Health Insurance - Indemnity | Source: Ambulatory Visit | Attending: Family | Admitting: Family

## 2010-08-28 ENCOUNTER — Telehealth: Payer: Self-pay | Admitting: Family

## 2010-08-28 DIAGNOSIS — R7989 Other specified abnormal findings of blood chemistry: Secondary | ICD-10-CM

## 2010-08-28 DIAGNOSIS — R945 Abnormal results of liver function studies: Secondary | ICD-10-CM | POA: Insufficient documentation

## 2010-08-28 LAB — HEPATITIS PANEL, ACUTE: HCV Ab: NEGATIVE

## 2010-08-28 NOTE — Telephone Encounter (Signed)
Please call patient and let her know that her ultrasound and blood work is all normal.

## 2010-08-29 NOTE — Telephone Encounter (Signed)
Pt.notified

## 2010-09-06 ENCOUNTER — Telehealth: Payer: Self-pay | Admitting: *Deleted

## 2010-09-06 ENCOUNTER — Telehealth: Payer: Self-pay | Admitting: Family

## 2010-09-06 NOTE — Telephone Encounter (Signed)
Called patient to follow up.  She reports slight improvement in her depression.  She has had some diarrhea since starting Viibryd. I assured her that this should be transient.  She has established with a therapist-Janet Dekes- who has recommended that she have therapy sessions twice a week.   Psychiatrist apt- May 9th, [facility].

## 2010-09-06 NOTE — Telephone Encounter (Signed)
Received call from Victorino Dike wanting verification of pt's return to work date as 09/12/10. Melissa completed form and extended leave of absence until 09/18/10. Forms faxed to [phone].

## 2010-09-08 ENCOUNTER — Encounter: Payer: Self-pay | Admitting: Family

## 2010-09-15 ENCOUNTER — Ambulatory Visit: Payer: Private Health Insurance - Indemnity | Admitting: Family

## 2010-09-15 ENCOUNTER — Telehealth: Payer: Self-pay | Admitting: Family

## 2010-09-15 DIAGNOSIS — Z0289 Encounter for other administrative examinations: Secondary | ICD-10-CM

## 2010-09-15 NOTE — Telephone Encounter (Signed)
Left message on machine for pt to return my call  

## 2010-09-15 NOTE — Telephone Encounter (Signed)
Please call patient and reschedule follow up.  Let her know that we need to update her employer.

## 2010-09-18 NOTE — Telephone Encounter (Signed)
Left message on machine for pt to return my call  

## 2010-09-20 NOTE — Telephone Encounter (Signed)
Encounter closed as pt has not returned my calls. 

## 2010-10-02 ENCOUNTER — Telehealth: Payer: Self-pay | Admitting: *Deleted

## 2010-10-02 NOTE — Telephone Encounter (Signed)
Received request for medical records from Disability [employer] in  [place].  Previous request received was forwarded to [facility] via inter office mail. Called and spoke to Terrie in Medical Records, she did not see previous request. Faxed today's request to Medical Records at [phone].

## 2010-10-06 ENCOUNTER — Ambulatory Visit (INDEPENDENT_AMBULATORY_CARE_PROVIDER_SITE_OTHER): Payer: Private Health Insurance - Indemnity | Admitting: Family

## 2010-10-06 ENCOUNTER — Encounter: Payer: Self-pay | Admitting: Family

## 2010-10-06 VITALS — BP 110/76 | HR 84 | Temp 98.0°F | Resp 16 | Ht 63.0 in | Wt 216.0 lb

## 2010-10-06 DIAGNOSIS — F32A Depression, unspecified: Secondary | ICD-10-CM

## 2010-10-06 DIAGNOSIS — F329 Major depressive disorder, single episode, unspecified: Secondary | ICD-10-CM

## 2010-10-06 MED ORDER — VILAZODONE HCL 40 MG PO TABS: 1.0000 | ORAL_TABLET | Freq: Every day | ORAL | Status: DC

## 2010-10-06 NOTE — Assessment & Plan Note (Signed)
This is only slightly improved today.  She completed a PHQ9 and scored 25 placing her in the severe depression category.  She was instructed to keep her upcoming apt with psychiatry, continue to see therapist, add PRN imodium for diarrhea on viibryd.  I instructed pt that diarrhea should improve with time.  Also, instructed her to go to the ED if she develops suicide ideation.  20 minutes spent with patient today.  All of this time was spent counseling pt on her depression.

## 2010-10-06 NOTE — Patient Instructions (Addendum)
You may use imodium over the counter as needed for diarrhea.   Keep your upcoming appointment with psychiatry. Go to the ER if you develop thoughts of suicide. Follow up in 6 weeks.

## 2010-10-06 NOTE — Progress Notes (Signed)
  Subjective:    Patient ID: [name], female    DOB: 28-Mar-1986, 25 y.o.   MRN: [id number]  HPI Ms.  Macias is a 25 yr old female who presents today for follow up.  She has been following with therapist at [facility].  She reports that she feels "numb" on the Viibryd.  Initially felt some better after starting the viibryd. Has apt with psychiatry on 6/14.  Feels like talking with therapist has helped her with some of her problems, but the depression "has not gone away."  Notes that she is having diarrhea 2x a day- "like water." Tried to go back to work, "I can't keep up with everybody else."  Wants to "just give that up." Reports that she has though on occasion- "that I would be better off if I wasn't here." But denies plan and states "I would never do anything because of my baby."   Review of Systems See HPI  Past Medical History  Diagnosis Date  . Depression   . Kidney stones     History   Social History  . Marital Status: Single    Spouse Name: N/A    Number of Children: 1  . Years of Education: N/A   Occupational History  . CUSTOMER SERVICE [employer]   Social History Main Topics  . Smoking status: Never Smoker   . Smokeless tobacco: Never Used  . Alcohol Use: No  . Drug Use: No  . Sexually Active: Not on file   Other Topics Concern  . Not on file   Social History Narrative   Regular exercise: no    Past Surgical History  Procedure Date  . Fracture surgery 2006    ankle  . Lithotripsy 2008    Family History  Problem Relation Age of Onset  . Cancer Maternal Aunt     breast; 3 aunts deceased & 1 living  . Cancer Maternal Grandmother 40    breast  . Alcohol abuse Maternal Grandfather   . Heart disease Paternal Grandfather     MI  . Heart disease Other     MI    No Known Allergies  Current Outpatient Prescriptions on File Prior to Visit  Medication Sig Dispense Refill  . clonazePAM (KLONOPIN) 0.5 MG tablet Take 0.5 mg by mouth 3 (three) times  daily as needed.        . Vilazodone HCl (VIIBRYD) 40 MG TABS Take 1 tablet by mouth daily.  30 tablet  0  . medroxyPROGESTERone (DEPO-PROVERA) 400 MG/ML SUSP Inject 400 mg into the muscle once. Every 3 months.         BP 110/76  Pulse 84  Temp(Src) 98 F (36.7 C) (Oral)  Resp 16  Ht 5\' 3"  (1.6 m)  Wt 216 lb 0.6 oz (97.995 kg)  BMI 38.27 kg/m2       Objective:   Physical Exam  Psychiatric: Her speech is normal and behavior is normal. Judgment and thought content normal. Her mood appears not anxious. Her affect is blunt and labile. Her affect is not inappropriate. She exhibits a depressed mood. She exhibits normal recent memory.   Gen: tearful female, awake, alert, NAD.  Well groomed today.        Assessment & Plan:

## 2010-10-18 NOTE — Telephone Encounter (Signed)
Received another request for medical records from disability deterimination services date 09/25/10. Spoke to [place] at [place] [phone]) and she verified that records had been released on 09/22/10 to disability for dates seen at the [facility] office as that is the address listed on the records release. Renee requested that I fax her the request so she could contact disability re: request clarification. Request faxed to [phone].

## 2010-11-06 ENCOUNTER — Inpatient Hospital Stay (HOSPITAL_COMMUNITY)
Admission: AD | Admit: 2010-11-06 | Discharge: 2010-11-06 | Disposition: A | Discharge status: Home / Self Care | Payer: Managed Care, Other (non HMO) | Source: Ambulatory Visit | Attending: Obstetrics and Gynecology | Admitting: Obstetrics and Gynecology

## 2010-11-06 ENCOUNTER — Inpatient Hospital Stay (HOSPITAL_COMMUNITY): Payer: Managed Care, Other (non HMO)

## 2010-11-06 DIAGNOSIS — N200 Calculus of kidney: Secondary | ICD-10-CM | POA: Insufficient documentation

## 2010-11-06 DIAGNOSIS — N390 Urinary tract infection, site not specified: Secondary | ICD-10-CM | POA: Insufficient documentation

## 2010-11-06 DIAGNOSIS — R109 Unspecified abdominal pain: Secondary | ICD-10-CM | POA: Insufficient documentation

## 2010-11-06 LAB — URINALYSIS, ROUTINE W REFLEX MICROSCOPIC
Glucose, UA: NEGATIVE mg/dL
Specific Gravity, Urine: >1.03 — ABNORMAL HIGH (ref 1.005–1.030)
Urobilinogen, UA: 0.2 mg/dL (ref 0.0–1.0)

## 2010-11-06 LAB — URINE MICROSCOPIC-ADD ON

## 2010-11-06 LAB — CBC
HCT: 39 % (ref 36.0–46.0)
MCH: 28.2 pg (ref 26.0–34.0)
MCHC: 32.3 g/dL (ref 30.0–36.0)
RDW: 13.9 % (ref 11.5–15.5)

## 2010-11-08 ENCOUNTER — Emergency Department (HOSPITAL_COMMUNITY): Payer: Managed Care, Other (non HMO)

## 2010-11-08 ENCOUNTER — Inpatient Hospital Stay (HOSPITAL_COMMUNITY)
Admission: EM | Admit: 2010-11-08 | Discharge: 2010-11-12 | DRG: 871 | Disposition: A | Discharge status: Home / Self Care | Payer: Managed Care, Other (non HMO) | Attending: Urology | Admitting: Urology

## 2010-11-08 DIAGNOSIS — Z87442 Personal history of urinary calculi: Secondary | ICD-10-CM

## 2010-11-08 DIAGNOSIS — A419 Sepsis, unspecified organism: Secondary | ICD-10-CM | POA: Diagnosis present

## 2010-11-08 DIAGNOSIS — R000 Tachycardia, unspecified: Secondary | ICD-10-CM

## 2010-11-08 DIAGNOSIS — N12 Tubulo-interstitial nephritis, not specified as acute or chronic: Secondary | ICD-10-CM | POA: Diagnosis present

## 2010-11-08 DIAGNOSIS — N201 Calculus of ureter: Secondary | ICD-10-CM | POA: Diagnosis present

## 2010-11-08 DIAGNOSIS — I498 Other specified cardiac arrhythmias: Secondary | ICD-10-CM | POA: Diagnosis present

## 2010-11-08 DIAGNOSIS — N179 Acute kidney failure, unspecified: Secondary | ICD-10-CM | POA: Diagnosis present

## 2010-11-08 DIAGNOSIS — D696 Thrombocytopenia, unspecified: Secondary | ICD-10-CM | POA: Diagnosis present

## 2010-11-08 DIAGNOSIS — E669 Obesity, unspecified: Secondary | ICD-10-CM | POA: Diagnosis present

## 2010-11-08 DIAGNOSIS — Z79899 Other long term (current) drug therapy: Secondary | ICD-10-CM

## 2010-11-08 DIAGNOSIS — I429 Cardiomyopathy, unspecified: Secondary | ICD-10-CM | POA: Diagnosis present

## 2010-11-08 DIAGNOSIS — R6520 Severe sepsis without septic shock: Secondary | ICD-10-CM | POA: Diagnosis present

## 2010-11-08 DIAGNOSIS — E876 Hypokalemia: Secondary | ICD-10-CM | POA: Diagnosis present

## 2010-11-08 DIAGNOSIS — N1330 Unspecified hydronephrosis: Secondary | ICD-10-CM | POA: Diagnosis present

## 2010-11-08 LAB — CBC
MCHC: 33.1 g/dL (ref 30.0–36.0)
Platelets: 115 10*3/uL — ABNORMAL LOW (ref 150–400)
RDW: 13.8 % (ref 11.5–15.5)
WBC: 22.2 10*3/uL — ABNORMAL HIGH (ref 4.0–10.5)

## 2010-11-08 LAB — URINALYSIS, ROUTINE W REFLEX MICROSCOPIC
Ketones, ur: NEGATIVE mg/dL
Nitrite: NEGATIVE
Specific Gravity, Urine: 1.03 (ref 1.005–1.030)
Urobilinogen, UA: 0.2 mg/dL (ref 0.0–1.0)
pH: 5 (ref 5.0–8.0)

## 2010-11-08 LAB — COMPREHENSIVE METABOLIC PANEL
Albumin: 2.6 g/dL — ABNORMAL LOW (ref 3.5–5.2)
Alkaline Phosphatase: 98 U/L (ref 39–117)
BUN: 30 mg/dL — ABNORMAL HIGH (ref 6–23)
Creatinine, Ser: 1.84 mg/dL — ABNORMAL HIGH (ref 0.50–1.10)
GFR calc Af Amer: 40 mL/min — ABNORMAL LOW (ref >60)
Glucose, Bld: 159 mg/dL — ABNORMAL HIGH (ref 70–99)
Potassium: 3.3 mEq/L — ABNORMAL LOW (ref 3.5–5.1)
Total Bilirubin: 0.6 mg/dL (ref 0.3–1.2)
Total Protein: 7.4 g/dL (ref 6.0–8.3)

## 2010-11-08 LAB — DIFFERENTIAL
Basophils Absolute: 0 10*3/uL (ref 0.0–0.1)
Eosinophils Absolute: 0 10*3/uL (ref 0.0–0.7)
Lymphocytes Relative: 2 % — ABNORMAL LOW (ref 12–46)
Monocytes Relative: 1 % — ABNORMAL LOW (ref 3–12)
Neutro Abs: 21.6 10*3/uL — ABNORMAL HIGH (ref 1.7–7.7)
Neutrophils Relative %: 97 % — ABNORMAL HIGH (ref 43–77)

## 2010-11-08 LAB — CK TOTAL AND CKMB (NOT AT ARMC)
CK, MB: 2.5 ng/mL (ref 0.3–4.0)
CK, MB: 1.8 ng/mL (ref 0.3–4.0)
Relative Index: INVALID (ref 0.0–2.5)
Relative Index: INVALID (ref 0.0–2.5)
Total CK: 95 U/L (ref 7–177)

## 2010-11-08 LAB — MAGNESIUM: Magnesium: 1.7 mg/dL (ref 1.5–2.5)

## 2010-11-08 LAB — MRSA PCR SCREENING: MRSA by PCR: POSITIVE — AB

## 2010-11-09 ENCOUNTER — Inpatient Hospital Stay (HOSPITAL_COMMUNITY): Payer: Managed Care, Other (non HMO)

## 2010-11-09 DIAGNOSIS — R6520 Severe sepsis without septic shock: Secondary | ICD-10-CM

## 2010-11-09 DIAGNOSIS — N179 Acute kidney failure, unspecified: Secondary | ICD-10-CM

## 2010-11-09 DIAGNOSIS — R6521 Severe sepsis with septic shock: Secondary | ICD-10-CM

## 2010-11-09 DIAGNOSIS — A419 Sepsis, unspecified organism: Secondary | ICD-10-CM

## 2010-11-09 DIAGNOSIS — I059 Rheumatic mitral valve disease, unspecified: Secondary | ICD-10-CM

## 2010-11-09 LAB — DIFFERENTIAL
Basophils Absolute: 0 10*3/uL (ref 0.0–0.1)
Basophils Absolute: 0 10*3/uL (ref 0.0–0.1)
Basophils Relative: 0 % (ref 0–1)
Eosinophils Absolute: 0.1 10*3/uL (ref 0.0–0.7)
Eosinophils Relative: 0 % (ref 0–5)
Lymphocytes Relative: 6 % — ABNORMAL LOW (ref 12–46)
Lymphs Abs: 0.3 10*3/uL — ABNORMAL LOW (ref 0.7–4.0)
Monocytes Absolute: 0 10*3/uL — ABNORMAL LOW (ref 0.1–1.0)
Monocytes Absolute: 0.1 10*3/uL (ref 0.1–1.0)
Monocytes Relative: 0 % — ABNORMAL LOW (ref 3–12)
Neutrophils Relative %: 96 % — ABNORMAL HIGH (ref 43–77)
Neutrophils Relative %: 92 % — ABNORMAL HIGH (ref 43–77)

## 2010-11-09 LAB — BASIC METABOLIC PANEL
CO2: 17 mEq/L — ABNORMAL LOW (ref 19–32)
CO2: 18 mEq/L — ABNORMAL LOW (ref 19–32)
Calcium: 7.4 mg/dL — ABNORMAL LOW (ref 8.4–10.5)
Chloride: 108 mEq/L (ref 96–112)
Creatinine, Ser: 1.38 mg/dL — ABNORMAL HIGH (ref 0.50–1.10)
GFR calc Af Amer: 37 mL/min — ABNORMAL LOW (ref >60)
GFR calc Af Amer: 55 mL/min — ABNORMAL LOW (ref >60)
Sodium: 137 mEq/L (ref 135–145)
Sodium: 137 mEq/L (ref 135–145)

## 2010-11-09 LAB — PROCALCITONIN: Procalcitonin: 80.5 ng/mL

## 2010-11-09 LAB — CBC
MCH: 27.2 pg (ref 26.0–34.0)
MCV: 82.2 fL (ref 78.0–100.0)
Platelets: 74 10*3/uL — ABNORMAL LOW (ref 150–400)
Platelets: 79 10*3/uL — ABNORMAL LOW (ref 150–400)
RBC: 3.53 MIL/uL — ABNORMAL LOW (ref 3.87–5.11)
RBC: 3.66 MIL/uL — ABNORMAL LOW (ref 3.87–5.11)
RDW: 14.1 % (ref 11.5–15.5)
RDW: 14.1 % (ref 11.5–15.5)
WBC: 8.2 10*3/uL (ref 4.0–10.5)
WBC: 12.6 10*3/uL — ABNORMAL HIGH (ref 4.0–10.5)

## 2010-11-09 LAB — LACTIC ACID, PLASMA: Lactic Acid, Venous: 0.2 mmol/L — ABNORMAL LOW (ref 0.5–2.2)

## 2010-11-10 DIAGNOSIS — I428 Other cardiomyopathies: Secondary | ICD-10-CM

## 2010-11-10 LAB — URINE CULTURE

## 2010-11-10 LAB — BASIC METABOLIC PANEL
BUN: 14 mg/dL (ref 6–23)
BUN: 11 mg/dL (ref 6–23)
CO2: 19 mEq/L (ref 19–32)
Calcium: 7.9 mg/dL — ABNORMAL LOW (ref 8.4–10.5)
Calcium: 7.5 mg/dL — ABNORMAL LOW (ref 8.4–10.5)
Chloride: 113 mEq/L — ABNORMAL HIGH (ref 96–112)
Creatinine, Ser: 1.07 mg/dL (ref 0.50–1.10)
GFR calc Af Amer: >60 mL/min (ref >60)
GFR calc non Af Amer: >60 mL/min (ref >60)
Glucose, Bld: 112 mg/dL — ABNORMAL HIGH (ref 70–99)

## 2010-11-10 LAB — CBC
HCT: 28.7 % — ABNORMAL LOW (ref 36.0–46.0)
MCH: 27.2 pg (ref 26.0–34.0)
MCV: 80.6 fL (ref 78.0–100.0)
RDW: 14.1 % (ref 11.5–15.5)
WBC: 10.3 10*3/uL (ref 4.0–10.5)

## 2010-11-10 LAB — DIFFERENTIAL
Eosinophils Absolute: 0.1 10*3/uL (ref 0.0–0.7)
Eosinophils Relative: 1 % (ref 0–5)
Lymphocytes Relative: 10 % — ABNORMAL LOW (ref 12–46)
Lymphs Abs: 1 10*3/uL (ref 0.7–4.0)
Monocytes Relative: 4 % (ref 3–12)

## 2010-11-11 LAB — CBC
MCH: 26.5 pg (ref 26.0–34.0)
MCHC: 33.2 g/dL (ref 30.0–36.0)
Platelets: 78 10*3/uL — ABNORMAL LOW (ref 150–400)
RDW: 14.4 % (ref 11.5–15.5)

## 2010-11-11 LAB — URINE CULTURE
Colony Count: >=100000
Culture  Setup Time: 201206280423
Special Requests: POSITIVE

## 2010-11-11 LAB — BASIC METABOLIC PANEL
Calcium: 7.6 mg/dL — ABNORMAL LOW (ref 8.4–10.5)
GFR calc Af Amer: >60 mL/min (ref >60)
GFR calc non Af Amer: >60 mL/min (ref >60)
Sodium: 140 mEq/L (ref 135–145)

## 2010-11-11 NOTE — Op Note (Signed)
[name], [name]                 ACCOUNT NO.:  [id number]  MEDICAL RECORD NO.:  [id number]  LOCATION:  1230                         FACILITY:  [facility]  PHYSICIAN:  Heloise Purpura, MD      DATE OF BIRTH:  30-Jul-1985  DATE OF PROCEDURE:  11/08/2010 DATE OF DISCHARGE:                              OPERATIVE REPORT   PREOPERATIVE DIAGNOSES: 1. Left ureteral stone. 2. Sepsis.  POSTOPERATIVE DIAGNOSES: 1. Left ureteral stone. 2. Sepsis.  PROCEDURES: 1. Cystoscopy. 2. Left ureteral stent placement (6 x 24).  SURGEON:  Heloise Purpura, MD  ANESTHESIA:  General.  COMPLICATIONS:  None.  ESTIMATED BLOOD LOSS:  Not applicable.  INDICATIONS:  Ms. [name] is a 25 year old female who presented to the emergency department earlier today with severe tachycardia and borderline hypotension.  She also had left-sided flank pain and underwent a CT scan which demonstrated a 5-mm left distal ureteral calculus with hydronephrosis.  She was afebrile, although did have a leukocytosis and it was suspected that she might have left ureteral obstruction with infection.  She was therefore admitted to the step-down intensive care unit with plans to proceed to the operating room urgently this evening.  She did develop fever when admitted to the hospital consistent with infection.  After discussing the situation with her in detail and recommending that she proceed with urgent cystoscopy and left ureteral stent placement, she gave her informed consent to proceed as recommended.  The potential risks, complications, and alternative treatment options were discussed and detail informed consent obtained.  DESCRIPTION OF PROCEDURE:  The patient was taken to the operating room and a general anesthetic was administered.  She was given preoperative antibiotics, placed in the dorsal lithotomy position, and prepped and draped in the usual sterile fashion.  Next preoperative time-out was performed.  Cystourethroscopy  was then performed which revealed a normal anterior and posterior urethra.  Inspection of the bladder revealed no evidence of any bladder tumors, stones, or other mucosal pathology.  The ureteral orifices were identified in their normal anatomic position. The left ureteral orifice was identified and cannulated with a 0.038 sensor guidewire.  Immediately, there was noted to be thick purulent drainage from the left ureter.  A 6-French ureteral catheter was then passed over the guidewire and the wire was able to be manipulated easily past the obstructing stone which was seen on fluoroscopy.  Once the wire was passed up into the left renal pelvis, the 6-French ureteral catheter was advanced over the wire and a urine culture was obtained.  This urine was grossly purulent and cloudy.  The wire was then replaced into the left renal pelvis and back loaded on the cystoscope.  A 6 x 24 double-J ureteral stent was then advanced over the wire using Seldinger technique and positioned appropriately under fluoroscopic and cystoscopic guidance.  The wire was removed with a good curl noted in the renal pelvis as well as in the bladder.  The patient tolerated the procedure well and without complications.  She was able to be awakened and transferred to recovery unit in satisfactory condition.     Heloise Purpura, MD     LB/MEDQ  D:  11/08/2010  T:  11/08/2010  Job:  [id number]  Electronically Signed by Heloise Purpura MD on 11/11/2010 10:18:45 PM

## 2010-11-11 NOTE — Consult Note (Signed)
[name], [name]                 ACCOUNT NO.:  [id number]  MEDICAL RECORD NO.:  [id number]  LOCATION:  1230                         FACILITY:  [facility]  PHYSICIAN:  Heloise Purpura, MD      DATE OF BIRTH:  March 17, 1986  DATE OF CONSULTATION:  11/08/2010 DATE OF DISCHARGE:                                CONSULTATION   REASON FOR CONSULTATION:  Left ureteral stone.  HISTORY:  [name] is a 25 year old female with a history of urolithiasis who was seen at [facility] approximately 2 days ago after developing severe left-sided flank pain.  She underwent a renal ultrasound which demonstrated left hydronephrosis. Urinalysis at that time demonstrated many bacteria, 21-50 white blood cells, and 0-2 red blood cells.  A preliminary urine culture was negative, although it is being reincubated for better growth.  Her urine pregnancy was negative and her white blood count at that time was 15.8.  She was subsequently discharged from the emergency room at [facility] with plans to follow up in our office.  She presented to our office today and was seen by Jetta Lout, NPC.  She was noted to be tachycardic with a heart rate in the 160s and mildly hypotensive.  She was immediately sent to the emergency room for further evaluation.  She received IV fluid hydration and underwent an evaluation including cardiac enzymes with a mildly elevated troponin level.  She has been seen by Cardiology and it is felt to be unlikely that she has any significant cardiac issues, although they have elected to follow the patient to determine whether she may require further evaluation.  She has been asymptomatic from a cardiac standpoint with no complaints of chest pain or shortness of breath.  In the emergency department, she did undergo a CT scan of the abdomen and pelvis without contrast which demonstrated left hydroureteronephrosis with a left distal 5-6 mm ureteral calculus.  Her urinalysis  today demonstrated few bacteria, 3-6 red blood cells, and 0-2 white blood cells.  She has been on ciprofloxacin since her evaluation at [facility] on Monday.  She specifically denies any fever, but has had significant nausea and vomiting and has not had got anything down since Sunday night.  PAST MEDICAL HISTORY:  Urolithiasis.  PAST SURGICAL HISTORY: 1. Shock wave lithotripsy. 2. Ankle surgery.  MEDICATIONS: 1. Cipro. 2. Lortab.  Her pain has not been well controlled with Lortab.  She is     on no chronic prescription medications.  ALLERGIES:  No known drug allergies.  FAMILY HISTORY:  No history of urolithiasis or renal disease.  SOCIAL HISTORY:  She denies tobacco or alcohol use.  REVIEW OF SYSTEMS:  A complete review of systems was performed. Pertinent positives are as included in the history of present illness.  PHYSICAL EXAMINATION:  VITAL SIGNS:  Temperature 98.6, blood pressure 91/67, heart rate 133. CONSTITUTIONAL:  The patient is a well-nourished, well-developed age- appropriate female, in no acute distress. HEENT:  Normocephalic, atraumatic. NECK:  Supple without lymphadenopathy or JVD. CARDIOVASCULAR:  Regular rhythm, tachycardic. LUNGS:  Clear bilaterally. ABDOMEN:  Obese, soft, mild-to-moderate tenderness in the left upper quadrant without rebound, tenderness, or guarding. BACK:  Moderate left CVA tenderness. EXTREMITIES:  No edema. NEUROLOGIC:  Grossly intact.  LABORATORY DATA:  Her initial troponin was 0.35 and her repeat troponin is less than 0.3 with a normal range.  Serum creatinine is 1.84.  White blood count 22.2, hemoglobin 12.5.  Urine culture from November 06, 2010, is preliminarily negative, although has been reintubated for better growth.  RADIOLOGIC IMAGING:  I independently reviewed her CT scan with findings as dictated above.  Specifically, she has a 5-6 mm left UVJ calculus with associated hydroureteronephrosis.  IMPRESSION:  Distal  left ureteral calculus with possible infection.  PLAN:  I have recommended that she proceed to the operating room this evening for urgent cystoscopy and ureteral stent placement considering the strong possibility of an obstructing stone with infection which result in a life threatening infection.  We have reviewed the potential risks and complications of this procedure including but not limited to worsening infection, bleeding, failure to be able to place a stent, and possible need for percutaneous nephrostomy drainage, damage to the urinary tract or kidney, and need for further procedures in the future.  She is agreeable to proceed in this fashion. She will be admitted to the hospital postoperatively.  I also will empirically begin broad-spectrum IV antibiotics.     Heloise Purpura, MD     LB/MEDQ  D:  11/08/2010  T:  11/09/2010  Job:  [id number]  Electronically Signed by Heloise Purpura MD on 11/11/2010 10:19:46 PM

## 2010-11-12 LAB — CBC
MCH: 26.7 pg (ref 26.0–34.0)
Platelets: 86 10*3/uL — ABNORMAL LOW (ref 150–400)
RBC: 3.82 MIL/uL — ABNORMAL LOW (ref 3.87–5.11)
WBC: 13.3 10*3/uL — ABNORMAL HIGH (ref 4.0–10.5)

## 2010-11-15 LAB — CULTURE, BLOOD (ROUTINE X 2)
Culture  Setup Time: 201206280401
Culture: NO GROWTH
Culture: NO GROWTH

## 2010-11-16 NOTE — Consult Note (Addendum)
[name]                 ACCOUNT NO.:  [id number]  MEDICAL RECORD NO.:  [id number]  LOCATION:  1230                         FACILITY:  [facility]  PHYSICIAN:  Doylene Canning. Ladona Ridgel, MD    DATE OF BIRTH:  1986-02-09  DATE OF CONSULTATION:  11/08/2010 DATE OF DISCHARGE:                                CONSULTATION   PRIMARY CARDIOLOGIST:  New.  PRIMARY MEDICAL DOCTOR:  Sandford Craze, NP  CHIEF COMPLAINT:  Flank pain.  HISTORY OF PRESENT ILLNESS:  Ms. [name] is a 25 year old female with a past medical history significant only for kidney stones, who was admitted with flank pain, in the setting of left hydronephrosis with possible obstruction and infection.  We are asked to see her regarding tachycardia.  She was seen at [facility] on Monday and subsequently referred to Urology.  When she presented for evaluation at our office today, she was noted to be tachycardic.  She was subsequently told to come to the [facility] ER for further stabilization.  Heart rate was noted to be 160.  She was given adenosine 6 mg, then 12 mg, but no complete cessation of arrhythmia was achieved.  She was given IV fluid with some improvement in her heart rate in the 130 range.  She has noticed an elevated heart rate since Monday accompanied by some shortness of breath, no chest pain.  Her blood pressure was initially elevated on admission, but is now in the 80s to low 100s systolic.  She endorses ongoing nausea and left flank pain.  She is anxious to have surgery on what may be a possible kidney stone.  The ER is proceeding with a possible CT to likely a medical mission with urologic consult. Her white blood cell count is up to 22.2 and creatinine is 1.84.  No fever.  Cardiac enzymes were drawn which showed a troponin of 0.35, but negative CK and MB.  Upon review of her EKG with Dr. Ladona Ridgel, it was felt that she likely has sinus tachycardia versus atrial tachycardia, both more likely felt sinus  tach at this time.  PAST MEDICAL HISTORY:  Kidney stone status post lithotripsy in 2008.  PAST SURGICAL HISTORY:  Ankle repair in 2006.  No prior cardiac history or cardiac workup.  MEDICATIONS AS AN OUTPATIENT:  None.  ALLERGIES:  None.  SOCIAL HISTORY:  The patient lives with her boyfriend.  She is unemployed at this time.  She has one daughter.  She does not have any history of tobacco abuse and denies any alcohol or drug use.  She denies any herbal medication use as well.  FAMILY HISTORY:  Her mother, father, and siblings are all in good health without known coronary or other medical problems.  REVIEW OF SYSTEMS:  Negative for overt fever, but her temperature is 99.1 at this time.  She denies any chest pain, but endorses shortness of breath and an uncomfortable sensation of an elevated heart rate. Positive for nausea, negative for vomiting or diarrhea, but she does feel the urge to spit up every so often.  All other systems reviewed and otherwise negative.  LABORATORY DATA:  WBC 22.2, hemoglobin 12.5, hematocrit 37.8, platelet  count 115.  Sodium 136, potassium 3.3, chloride 99, CO2 of 17, glucose 159, BUN 30, creatinine 1.84.  LFTs within normal limits with the exception of albumin 2.6.  Urine hCG on November 06, 2010, was negative.  CK 95, MB 2.5, troponin 0.35.  UA showed 100 glucose, small bilirubin, moderate blood, 100 protein, trace leukocytes.  RADIOLOGIC STUDIES:  Renal ultrasound showed mild left hydronephrosis and nonvisualization of the left ureteral tract.  PHYSICAL EXAMINATION:  VITAL SIGNS:  Pulse 151, temperature 99.1, respirations 24, blood pressure 93/48, pulse ox 100% on 2 L. GENERAL:  This is a pleasant, but uncomfortable-appearing African American female in no acute distress. HEENT:  Normocephalic, atraumatic with extraocular movements intact. Clear sclerae.  Nares are without discharge. NECK:  Supple without carotid bruits. HEART:  Auscultation of the  heart reveals fast and regular heart rate with no murmurs, rubs, or gallops. LUNGS:  Clear to auscultation bilaterally without wheezes, rales, or rhonchi. ABDOMEN:  Soft, nontender, nondistended with positive bowel sounds. EXTREMITIES:  Warm and dry and was free of sock line edema. NEUROLOGIC:  She is alert and oriented x3, responds to questions appropriately with a normal affect.  ASSESSMENT AND PLAN:  The patient was seen and examined by Dr. Ladona Ridgel and myself.  This is a 25 year old female with left hydronephrosis in the setting of possible kidney stones with urinary tract infection and acute renal insufficiency, whom we are asked to see regarding tachycardia.  At this time, her EKG was personally reviewed by Dr. Ladona Ridgel and was felt to be atrial tachycardia versus sinus tachycardia, but most likely sinus tachycardia at this time.  The treatment will be mainly focused on the underlying cause.  We recommend to proceed with IV hydration, treat the underlying infection, a neurologic etiology, and consider holding on surgery until her volume status is improved.  Low- dose Cardizem or Lopressor with attention to her blood pressure may beused if her tachycardia is refractory to the above.  Her isolated elevated troponin is likely related to a demand mismatch at this time secondary to her tachycardia, we will follow but may not need further workup.  Thank you for the opportunity to participate in the care of this patient.     Dayna Dunn, P.A.C.   ______________________________ Doylene Canning. Ladona Ridgel, MD   DD/MEDQ  D:  11/08/2010  T:  11/09/2010  Job:  [id number]  cc:   Sandford Craze, NP  Electronically Signed by Ronie Spies  on 11/16/2010 01:47:35 PM Electronically Signed by Lewayne Bunting MD on 11/30/2010 08:36:51 AM

## 2010-11-21 ENCOUNTER — Other Ambulatory Visit (HOSPITAL_COMMUNITY): Payer: Managed Care, Other (non HMO)

## 2010-11-23 ENCOUNTER — Encounter (HOSPITAL_COMMUNITY): Payer: Managed Care, Other (non HMO)

## 2010-11-23 ENCOUNTER — Other Ambulatory Visit: Payer: Self-pay | Admitting: Urology

## 2010-11-23 LAB — SURGICAL PCR SCREEN: MRSA, PCR: NEGATIVE

## 2010-11-23 LAB — BASIC METABOLIC PANEL
Chloride: 106 mEq/L (ref 96–112)
Creatinine, Ser: 0.68 mg/dL (ref 0.50–1.10)
GFR calc Af Amer: >60 mL/min (ref >60)
Potassium: 4 mEq/L (ref 3.5–5.1)

## 2010-11-23 LAB — HCG, SERUM, QUALITATIVE: Preg, Serum: NEGATIVE

## 2010-11-28 NOTE — Discharge Summary (Signed)
[name], [name]                 ACCOUNT NO.:  [id number]  MEDICAL RECORD NO.:  [id number]  LOCATION:  1428                         FACILITY:  [facility]  PHYSICIAN:  Heloise Purpura, MD      DATE OF BIRTH:  1986-03-16  DATE OF ADMISSION:  11/08/2010 DATE OF DISCHARGE:  11/12/2010                              DISCHARGE SUMMARY   ADMISSION DIAGNOSES: 1. Obstructing left ureteral calculus. 2. Tachycardia. 3. Pyelonephritis with early sepsis.  DISCHARGE DIAGNOSES: 1. Obstructing left ureteral calculus. 2. Tachycardia and cardiomyopathy felt to be acutely secondary to     sepsis.Marland Kitchen 3. Pyelonephritis with early sepsis.  HISTORY AND PHYSICAL:  For full details, please see admission history and physical.  Briefly, [name] is a 25 year old female, who was evaluated in the Emergency Department with left-sided flank and left lower quadrant pain and significant tachycardia with the heart rate in the one 160s.  She was also noted to be mildly hypotensive with systolic blood pressures measuring around 90 despite fluid resuscitation.  In the Emergency Department, she was evaluated by cardiology and was felt that she did have sinus tachycardia likely a result of her underlying pain and other diagnoses.  A CT scan of the abdomen and pelvis without contrast did reveal an obstructing 5-mm distal left ureteral calculus and she did have a significant leukocytosis.  She had presented to [facility] approximately 2 days earlier and did have left-sided hydronephrosis on ultrasound with no further evaluation performed at that time.  It was felt that she might have a urinary tract infection and she was placed on ciprofloxacin, although her urine culture was pending at the time of her Emergency Room evaluation.  A urology consultation was obtained and it was felt that the patient's situation was consistent with a possible obstructing left ureteral calculus with left pyelonephritis and  early sepsis.  HOSPITAL COURSE:  On November 08, 2010, the patient was evaluated in the Emergency Room and it was felt that she should proceed directly to the operating room for urgent left ureteral stent placement to un-obstruct her left renal collecting system.  She was not febrile on admission; however, upon further evaluation while she was waiting for her procedure, she did develop a fever to 102 and continued to remain mildly unstable with borderline low blood pressures despite aggressive fluid resuscitation.  She was taken to the operating room that evening and underwent left ureteral stent placement with thick purulent material seen upon relief of her obstruction.  She was placed on broad-spectrum IV antibiotics and admitted to the intensive care unit.  She did require phenylephrine for treatment of her hypotension and a central line was placed by critical care medicine, who had been consulted for assistance with her care.  She improved significantly the following day and her phenylephrine was able to be weaned off fairly rapidly.  She continued to receive aggressive fluid resuscitation and her blood pressure stabilized.  She continued to remain significantly tachycardic and she did undergo further evaluation during her hospitalization by the cardiology service including a 2-D echocardiogram, which did demonstrate cardiomyopathy with decreased left ventricular function felt to be secondary to her sepsis.  She was  maintained on broad-spectrum IV antibiotics until her urine culture from the November 06, 2010 returned and was found to be E. coli resistant to fluoroquinolone, but sensitive to penicillin agents and trimethoprim sulfamethoxazole.  Her vancomycin was subsequently discontinued and she was continued on Zosyn.  When she was able to tolerate oral antibiotics and food, she was subsequently transitioned to Bactrim double strength one p.o. b.i.d.  Her white blood count significantly  improved and her tachycardia improved and her heart rate upon discharge was normal as was her blood pressure.  She was able to tolerate oral Percocet for pain control and on November 12, 2010, she was evaluated and felt to be stable for discharge home.  DISPOSITION:  Home.  DISCHARGE MEDICATIONS:  She will take Percocet as needed for pain and will continue Bactrim double strength 1 tablet p.o. b.i.d. for 2 weeks.  DISCHARGE INSTRUCTIONS:  She has been instructed to resume activity and her diet as tolerated.  FOLLOWUP:  She will be scheduled for a repeat urine culture and subsequent definitive treatment of her stone following treatment of her acute infection.  She will also follow up as an outpatient with cardiology for repeat echocardiogram in approximately 1 month.     Heloise Purpura, MD     LB/MEDQ  D:  11/12/2010  T:  11/12/2010  Job:  [id number]  Electronically Signed by Heloise Purpura MD on 11/28/2010 08:09:03 AM

## 2010-11-30 ENCOUNTER — Ambulatory Visit (HOSPITAL_COMMUNITY): Payer: Managed Care, Other (non HMO)

## 2010-11-30 ENCOUNTER — Ambulatory Visit (HOSPITAL_COMMUNITY)
Admission: RE | Admit: 2010-11-30 | Discharge: 2010-11-30 | Disposition: A | Discharge status: Home / Self Care | Payer: Managed Care, Other (non HMO) | Source: Ambulatory Visit | Attending: Urology | Admitting: Urology

## 2010-11-30 DIAGNOSIS — Z01812 Encounter for preprocedural laboratory examination: Secondary | ICD-10-CM | POA: Insufficient documentation

## 2010-11-30 DIAGNOSIS — E669 Obesity, unspecified: Secondary | ICD-10-CM | POA: Insufficient documentation

## 2010-11-30 DIAGNOSIS — Z01810 Encounter for preprocedural cardiovascular examination: Secondary | ICD-10-CM | POA: Insufficient documentation

## 2010-11-30 DIAGNOSIS — N201 Calculus of ureter: Secondary | ICD-10-CM | POA: Insufficient documentation

## 2010-11-30 NOTE — Op Note (Signed)
  NAMESHERISE, GEERDES                 ACCOUNT NO.:  [id number]  MEDICAL RECORD NO.:  [id number]  LOCATION:  DAYL                         FACILITY:  [facility]  PHYSICIAN:  Heloise Purpura, MD      DATE OF BIRTH:  Oct 22, 1985  DATE OF PROCEDURE:  11/30/2010 DATE OF DISCHARGE:                              OPERATIVE REPORT   PREOPERATIVE DIAGNOSIS:  Left ureteral calculus.  POSTOPERATIVE DIAGNOSIS:  Left ureteral calculus.  PROCEDURE: 1. Cystoscopy. 2. Left ureteroscopic stone removal.  SURGEON:  Heloise Purpura, MD  ANESTHESIA:  General.  COMPLICATIONS:  None.  INDICATION:  [name] is a 25 year old female who presented to the hospital a few weeks ago with an obstructing stone and sepsis.  She underwent ureteral stent placement and was maintained on culture specific antibiotics for 2 weeks.  She followed up and her infection had completely resolved and her repeat urine culture was negative.  We therefore discussed definitive treatment options for her remaining ureteral stone and she elected to proceed with ureteroscopic stone removal.  The potential risks, complications, alternative treatment options associated with the above procedure were discussed in detail and informed consent obtained.  DESCRIPTION OF PROCEDURE:  The patient was taken to the operating room and a general anesthetic was administered.  She was given preoperative antibiotics, placed in the dorsal lithotomy position, and prepped and draped in the usual sterile fashion.  Next, preoperative time-out was performed.  Cystourethroscopy was then performed which revealed a normal anterior urethra.  Inspection of the bladder revealed an indwelling ureteral left stent without other mucosal abnormalities, stones, or other abnormal findings.  The left ureteral stent was brought out to the urethral meatus with the aid of the flexible grasper.  A 0.038 sensor guidewire was then advanced through the stent up into the left  renal pelvis under fluoroscopic guidance and the stent was removed.  6-French semi-rigid ureteroscope was then advanced next to the wire up into the distal left ureter where the patient's stone was identified.  The ureter was dilated after having been drained with a stent over the past few weeks and the stone was able to be removed intact via a ZeroTip nitinol basket.  It was not felt that the patient would require further ureteral stenting and the guidewire was removed.  Her bladder was emptied and the procedure was ended.  She tolerated the procedure well without complications.  Her stone will be sent to [facility] specialist for stone analysis.     Heloise Purpura, MD     LB/MEDQ  D:  11/30/2010  T:  11/30/2010  Job:  [id number]  Electronically Signed by Heloise Purpura MD on 11/30/2010 11:28:04 PM

## 2011-11-29 IMAGING — US US FETAL BPP W/O NONSTRESS
1 series · 8 of 8 positions shown · non-contrast
Comparison: none

OBSTETRICAL ULTRASOUND:
 This ultrasound exam was performed in the [HOSPITAL] Ultrasound Department.  The OB US report was generated in the AS system, and faxed to the ordering physician.  This report is also available in [HOSPITAL]?s AccessANYware and in [REDACTED] PACS.

[Series 1: us fetal bpp w/o nonstress · non-contrast · 0.30mm/px · 8 acquisitions, 8 frames shown]
[im 1/8]
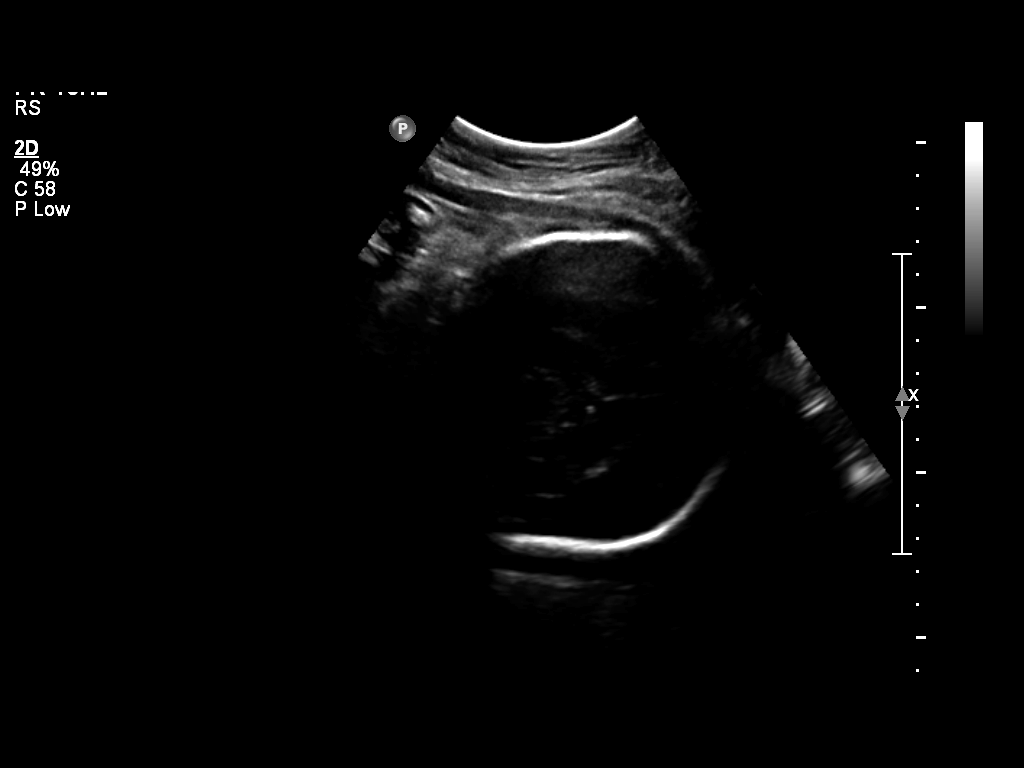
[im 2/8]
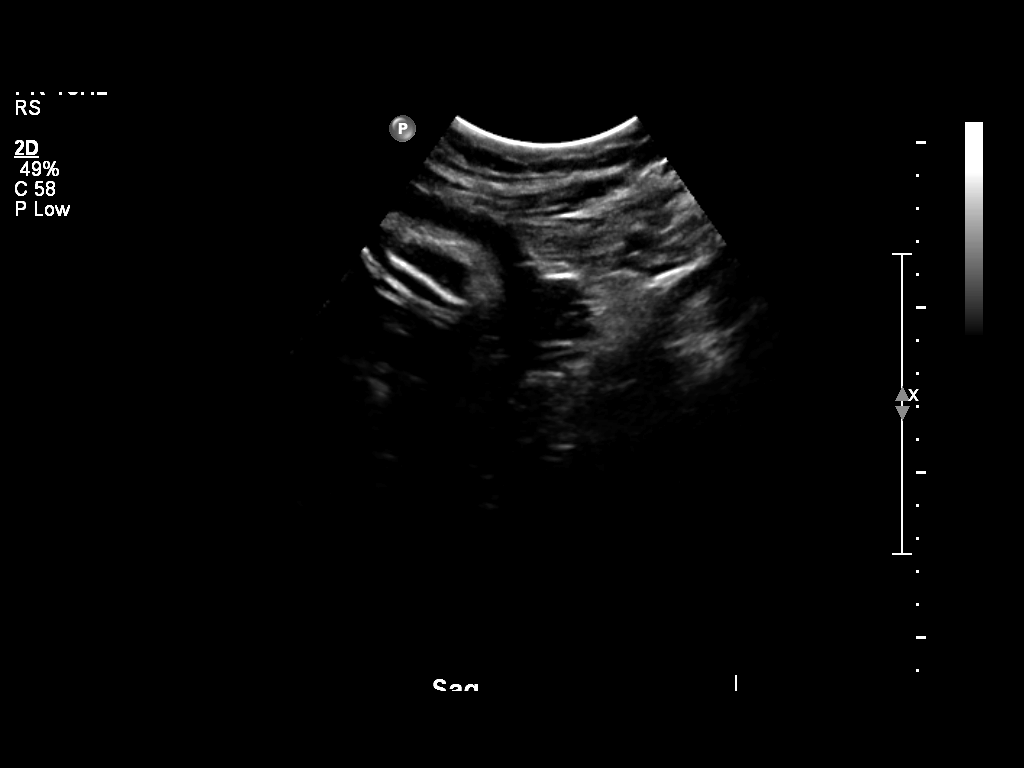
[im 3/8]
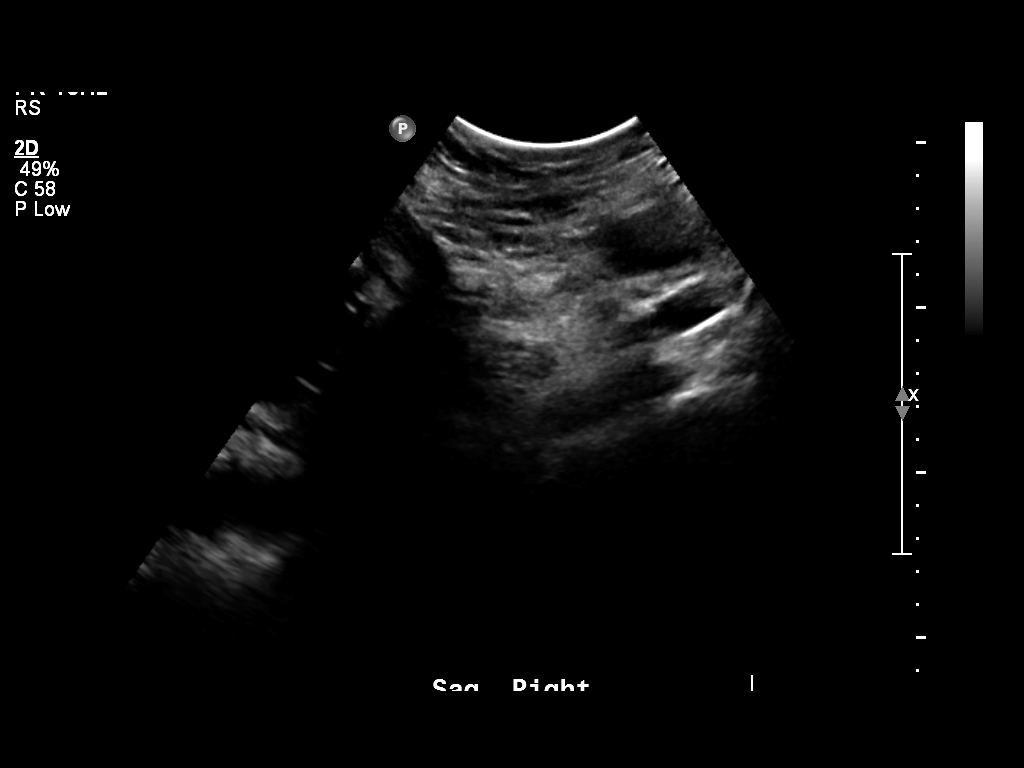
[im 4/8]
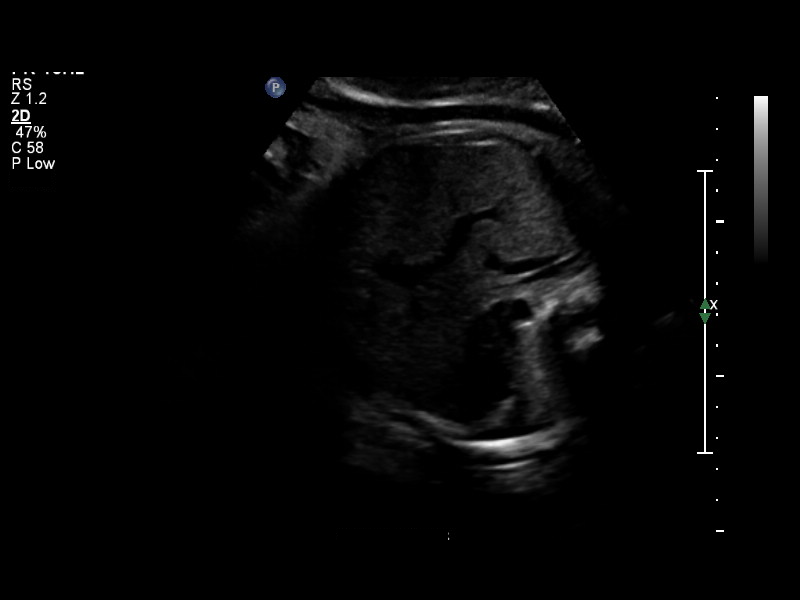
[im 5/8]
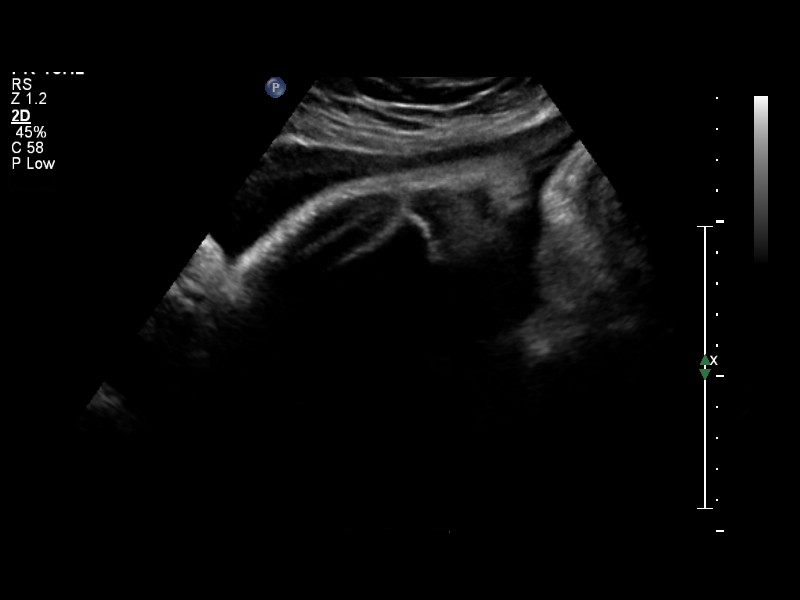
[im 6/8]
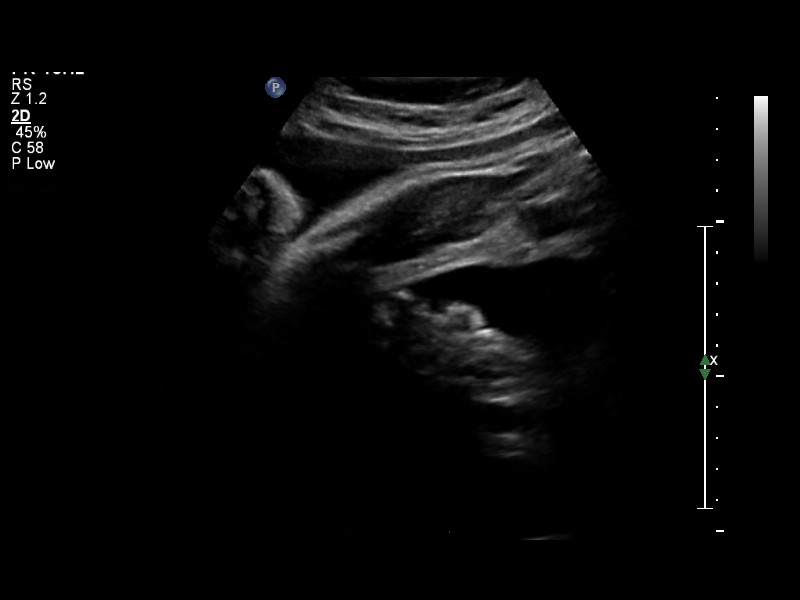
[im 7/8]
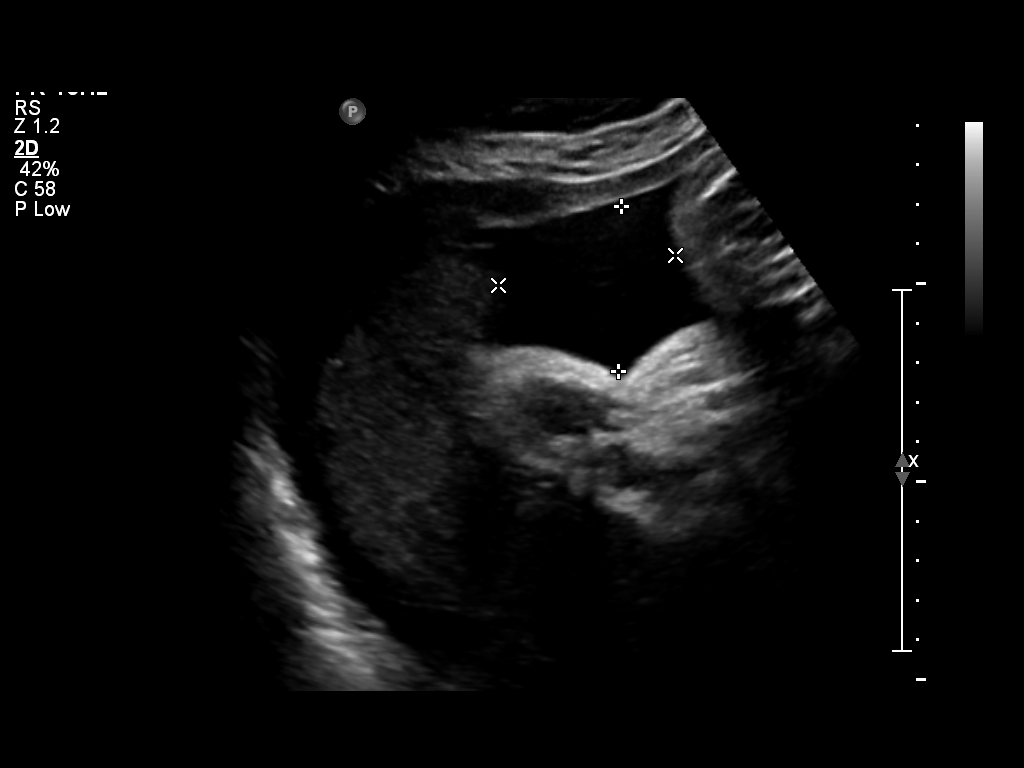
[im 8/8]
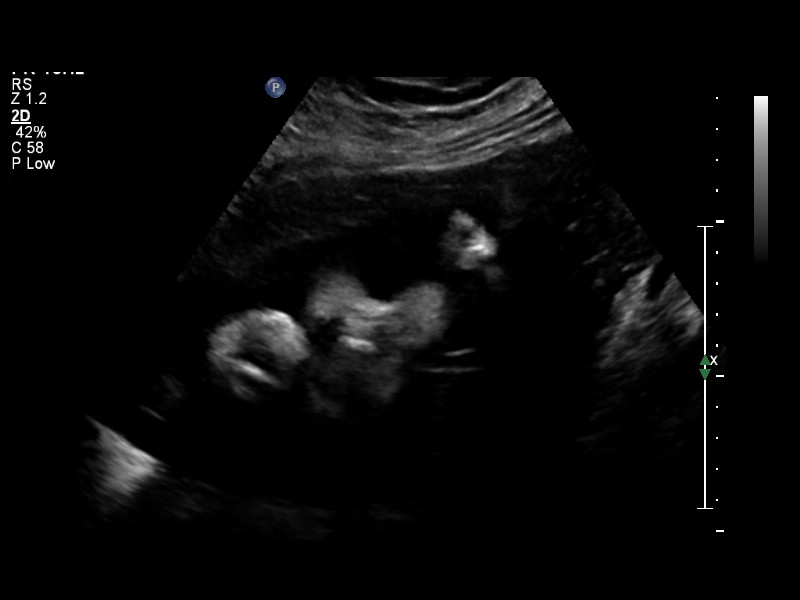

[8 of 8 positions shown; findings below may reference images not displayed]

IMPRESSION: See AS Obstetric US report.

## 2012-12-16 IMAGING — US US RENAL
1 series · 13 of 25 positions shown · non-contrast
Comparison: Renal ultrasound same date

CLINICAL DATA: Left-sided flank pain and hydronephrosis on prior
exam.  The patient was brought back for additional imaging,
including transvaginal imaging to attempt to visualize the distal
left ureter, after hydration.

RENAL/URINARY TRACT ULTRASOUND COMPLETE
TRANSVAGINAL ULTRASOUND OF PELVIS

[Series 1: us renal · 13 of 29 slices shown]
[im 1/29]
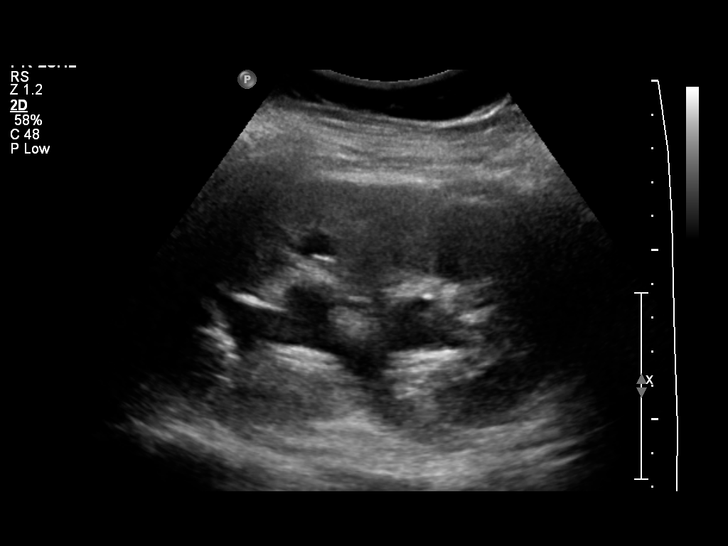
[im 3/29]
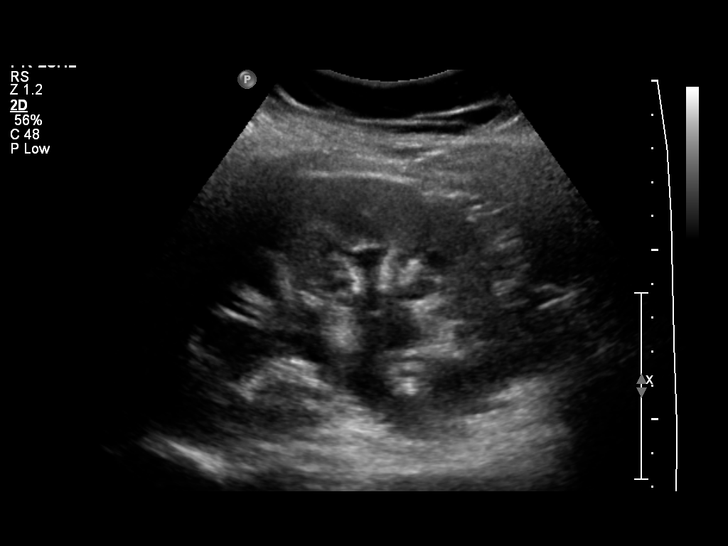
[im 5/29]
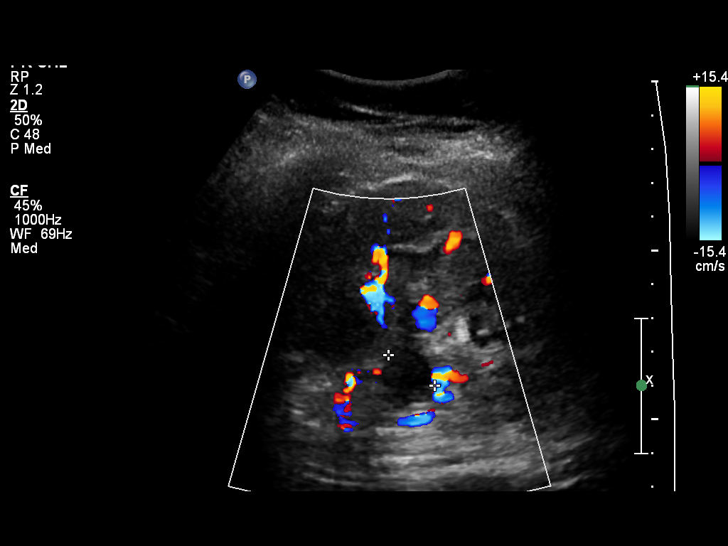
[im 8/29]
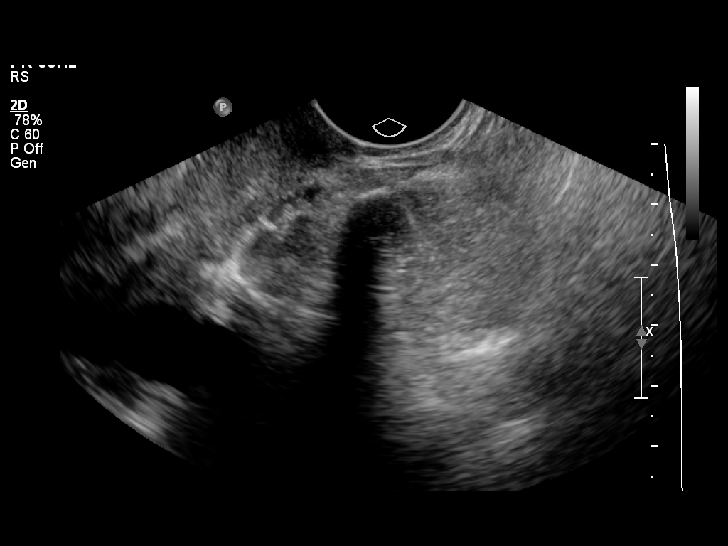
[im 10/29]
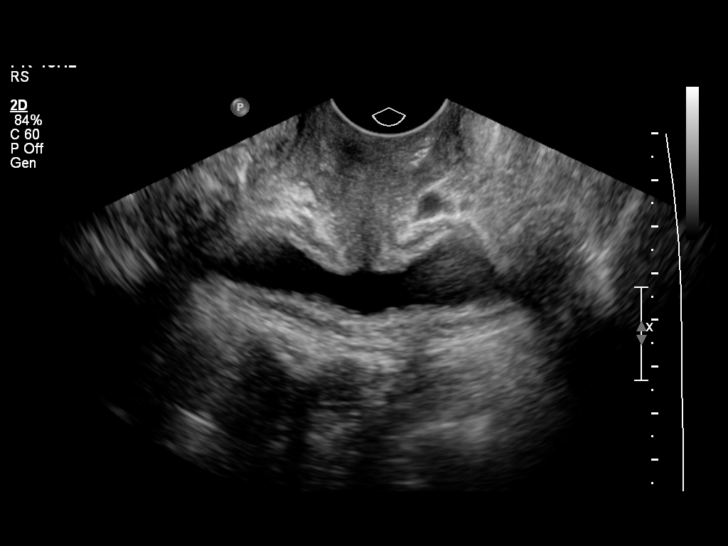
[im 12/29]
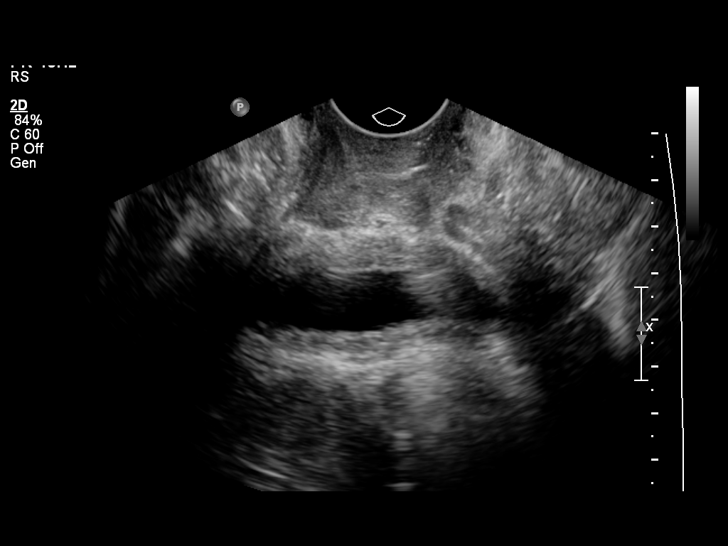
[im 15/29]
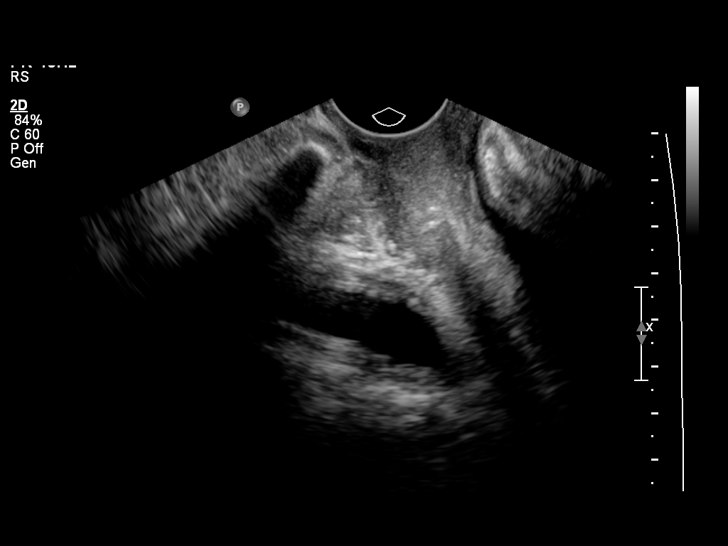
[im 17/29]
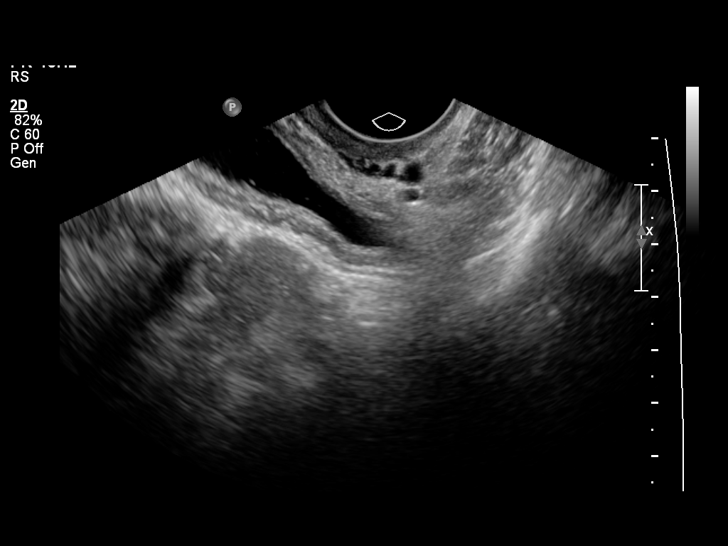
[im 19/29]
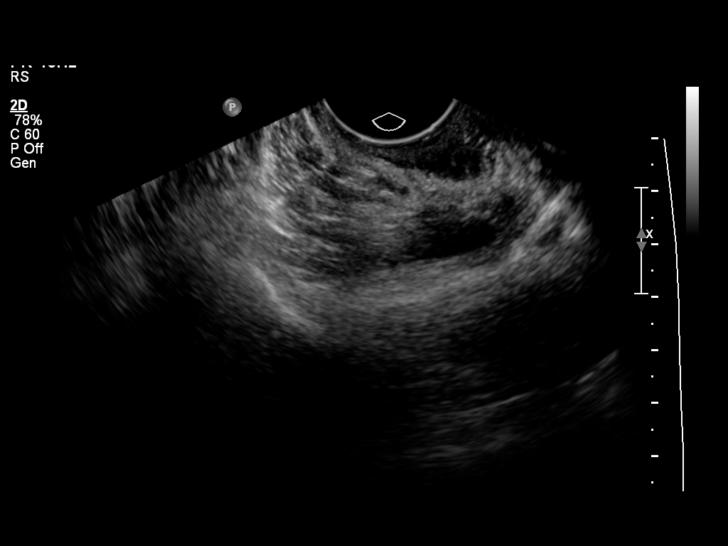
[im 22/29]
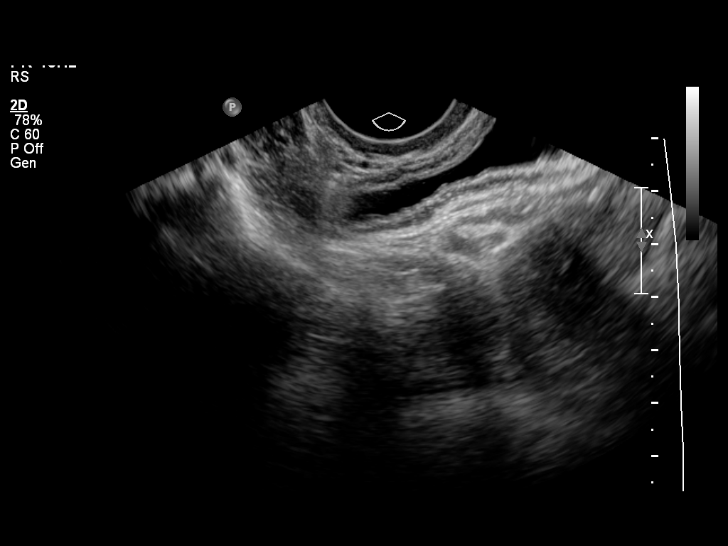
[im 24/29]
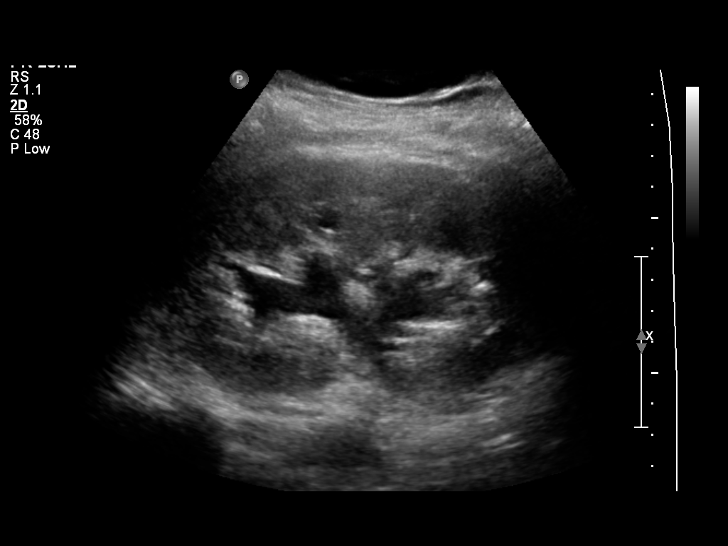
[im 26/29]
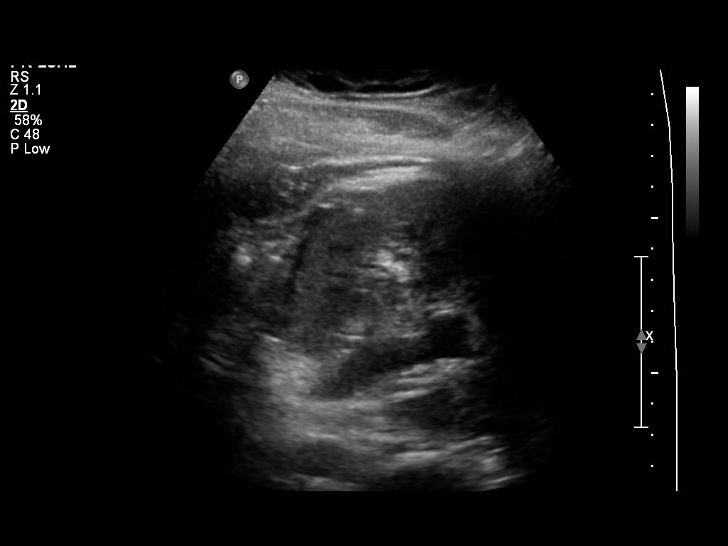
[im 29/29]
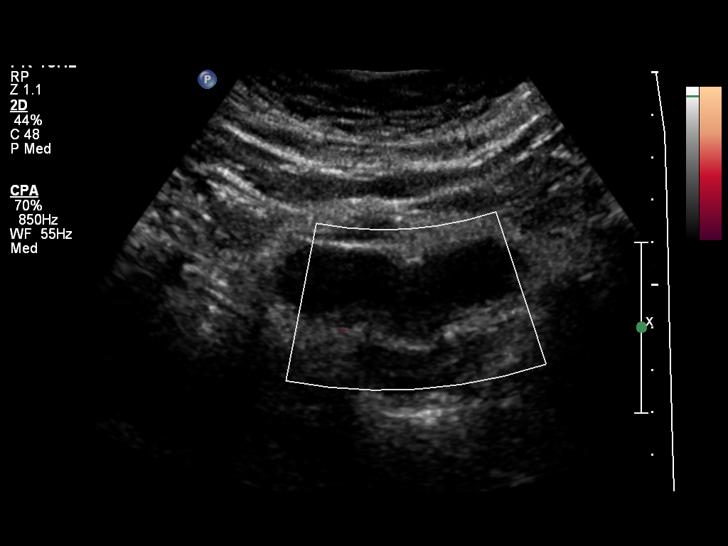

[13 of 25 positions shown; findings below may reference images not displayed]

FINDINGS: Right Kidney:  Unchanged on real time imaging.  Additional images
not obtained on this second exam.

Left Kidney:  13.8 cm, with persistent mild left hydronephrosis
that persisted after voiding.  Renal cortical thickness is
preserved and no proximal renal collecting system calculus is
identified.

Bladder:  Right ureteral jet visualized.  No left-sided ureteral
jet was seen.  Otherwise normal appearance.

Transvaginal imaging did not demonstrate distal ureteral dilatation
or calculus.  An echogenic focus seen on the left on one sagittal
image was felt to be located lateral to the expected location of
the nonvisualized ureter, per discussion with the technologist.
IMPRESSION: Persistent mild left hydronephrosis with nonvisualization of the
left ureteral jet, which may suggest obstruction along the length
of the non visualized ureter.  Noncontrast CT of the abdomen and
pelvis is recommended for further evaluation.

## 2012-12-16 IMAGING — US US RENAL
1 series · 14 of 25 positions shown · non-contrast
Comparison: Abdominal ultrasound 08/28/2010

CLINICAL DATA: Abdominal pain.  Prior lithotripsy for renal stones.

RENAL/URINARY TRACT ULTRASOUND COMPLETE

[Series 1: us renal · 14 of 30 slices shown]
[im 1/30]
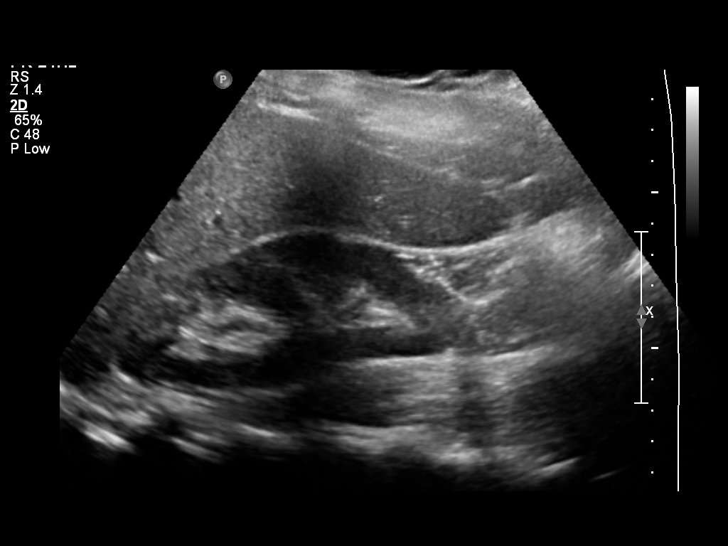
[im 3/30]
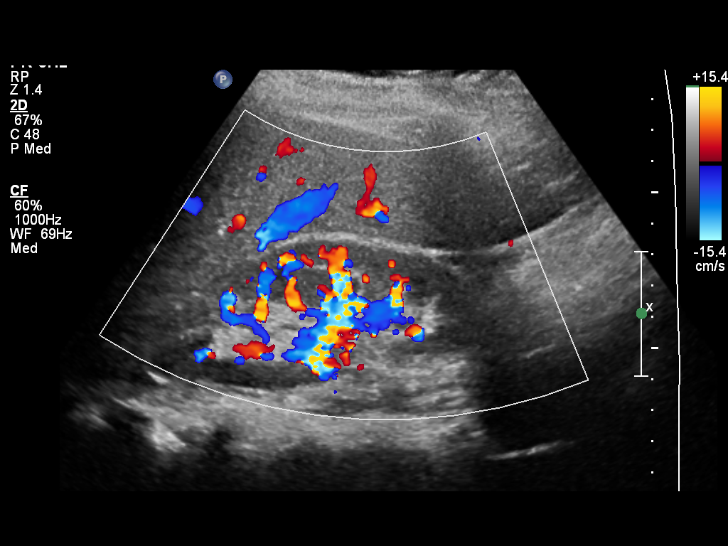
[im 5/30]
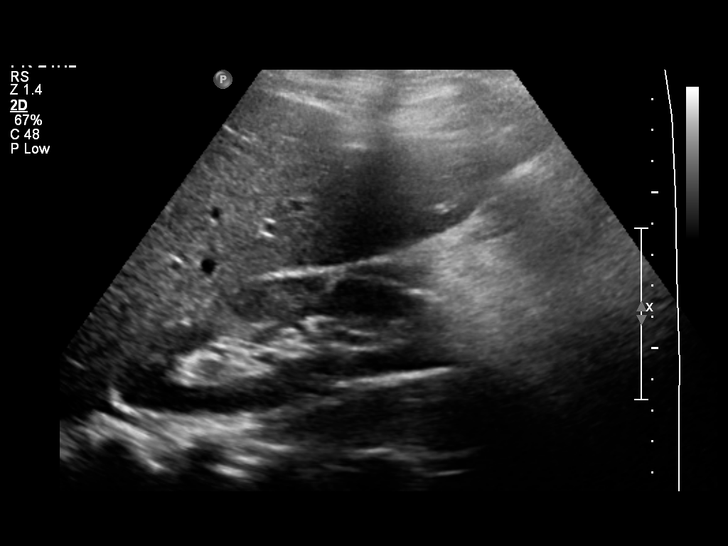
[im 8/30]
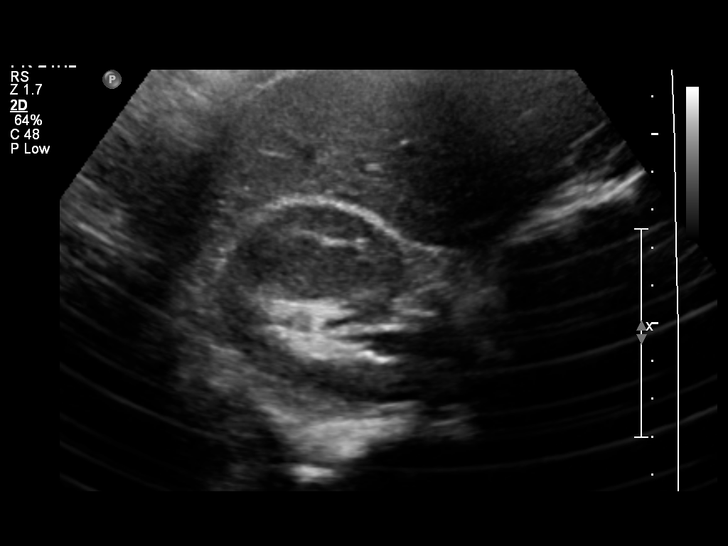
[im 10/30]
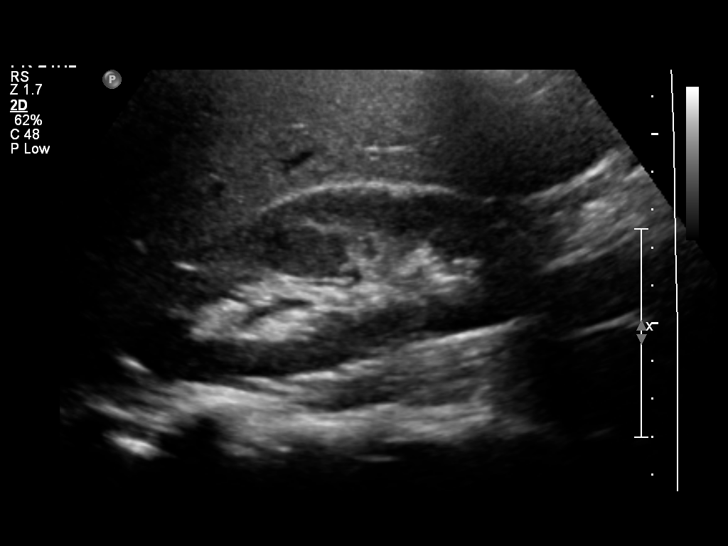
[im 11/30]
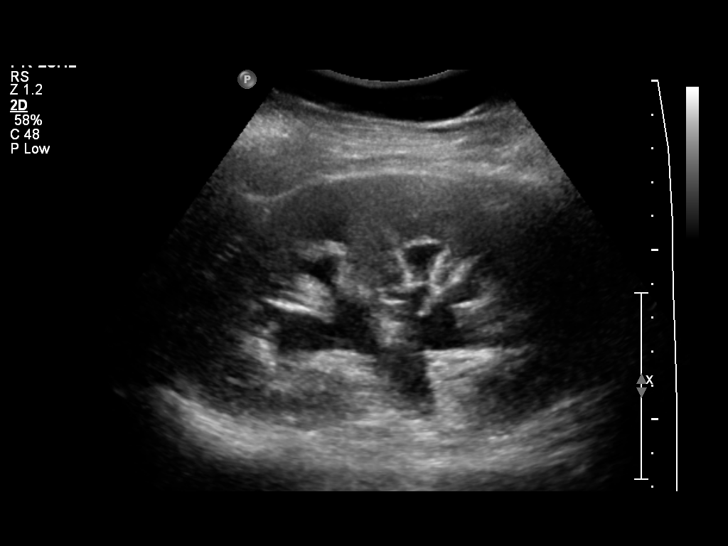
[im 14/30]
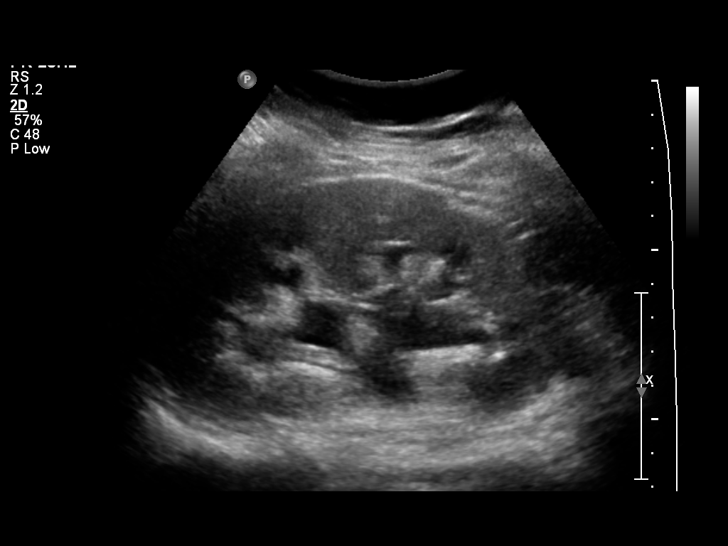
[im 16/30]
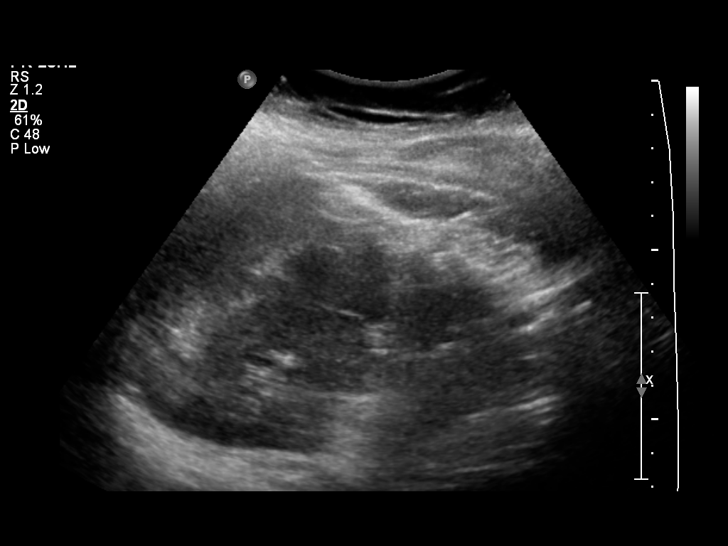
[im 19/30]
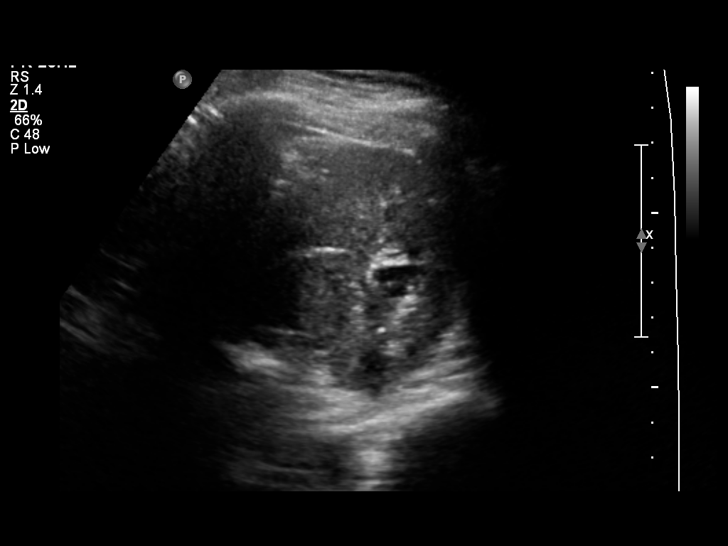
[im 20/30]
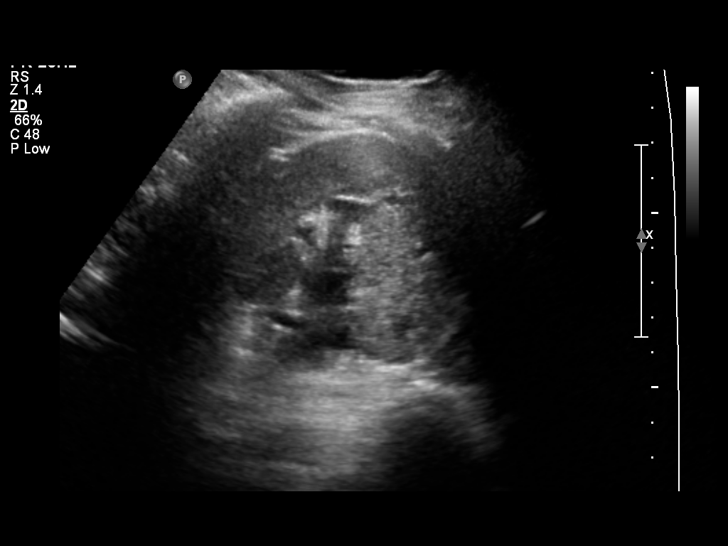
[im 22/30]
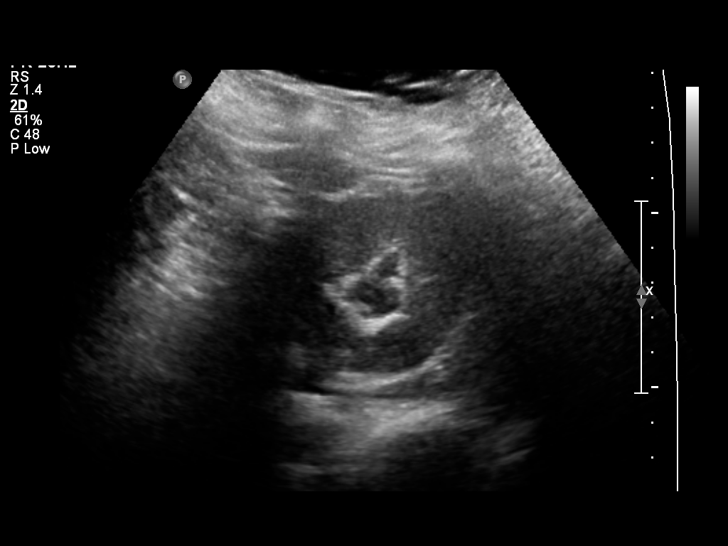
[im 25/30]
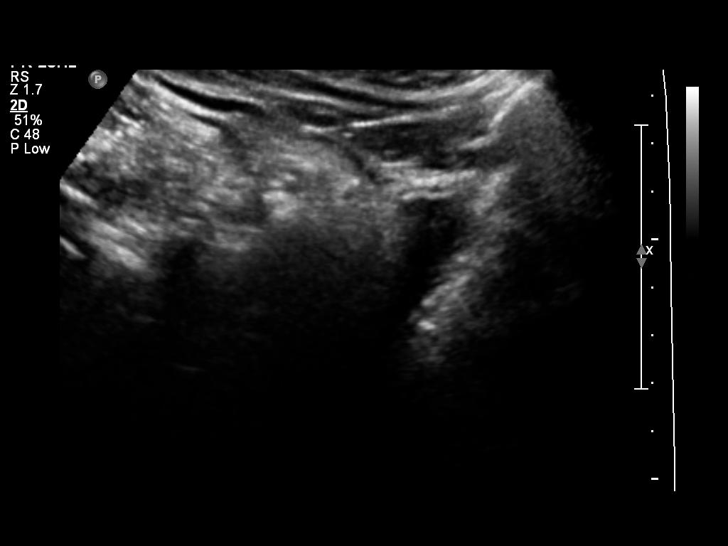
[im 27/30]
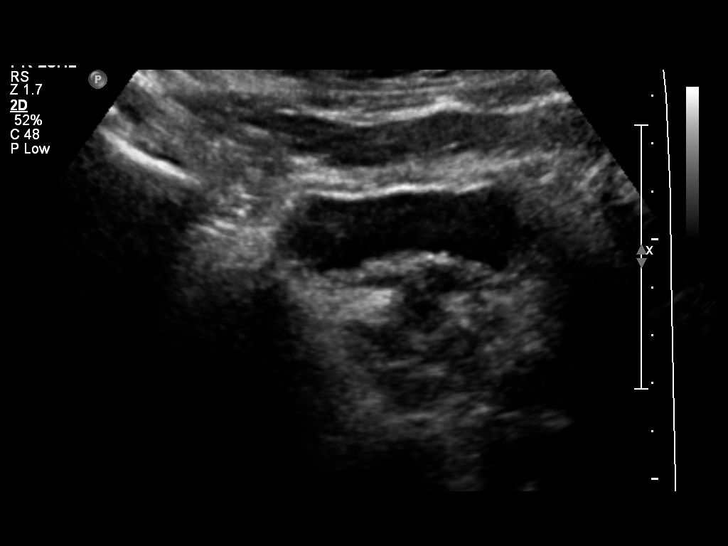
[im 30/30]
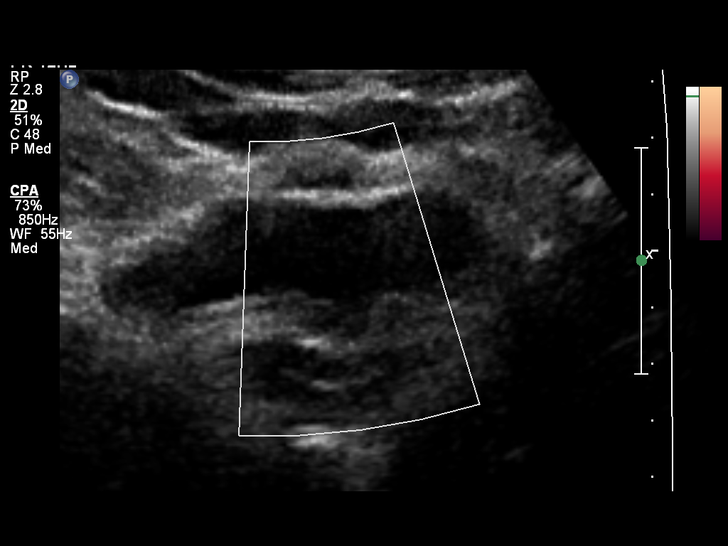

[14 of 25 positions shown; findings below may reference images not displayed]

FINDINGS: Right Kidney:  11.8 cm. No hydronephrosis or focal mass.

Left Kidney:  11.8 cm.  There is mild hydronephrosis without
visualized echogenic shadowing calculus.  The ureter is not
visualized.

Bladder:  Decompressed.  Neither ureteral jet could be visualized.
IMPRESSION: Mild left hydronephrosis.  No stone is visualized.  Neither
ureteral jet could be seen and the bladder is decompressed likely
related to the patient's being n.p.o.  Options include repeat
ultrasound after hydration, for better visualization of the bladder
and possible ureteral jets, at which time transvaginal pelvic
ultrasound would also be recommended to facilitate potential
identification of a distal ureteral calculus.  Abdomen CT
04/18/2010 did not demonstrate radiopaque calculi.  If expectant
management is not elected, plain abdominal radiograph or possibly
repeat CT without IV contrast could also be considered.

## 2012-12-18 IMAGING — CR DG CHEST 1V PORT
1 series · 1 of 1 positions shown · non-contrast
Comparison: None.

CLINICAL DATA: Flank pain, hematuria, palpitations.

PORTABLE CHEST - 1 VIEW

[AP]
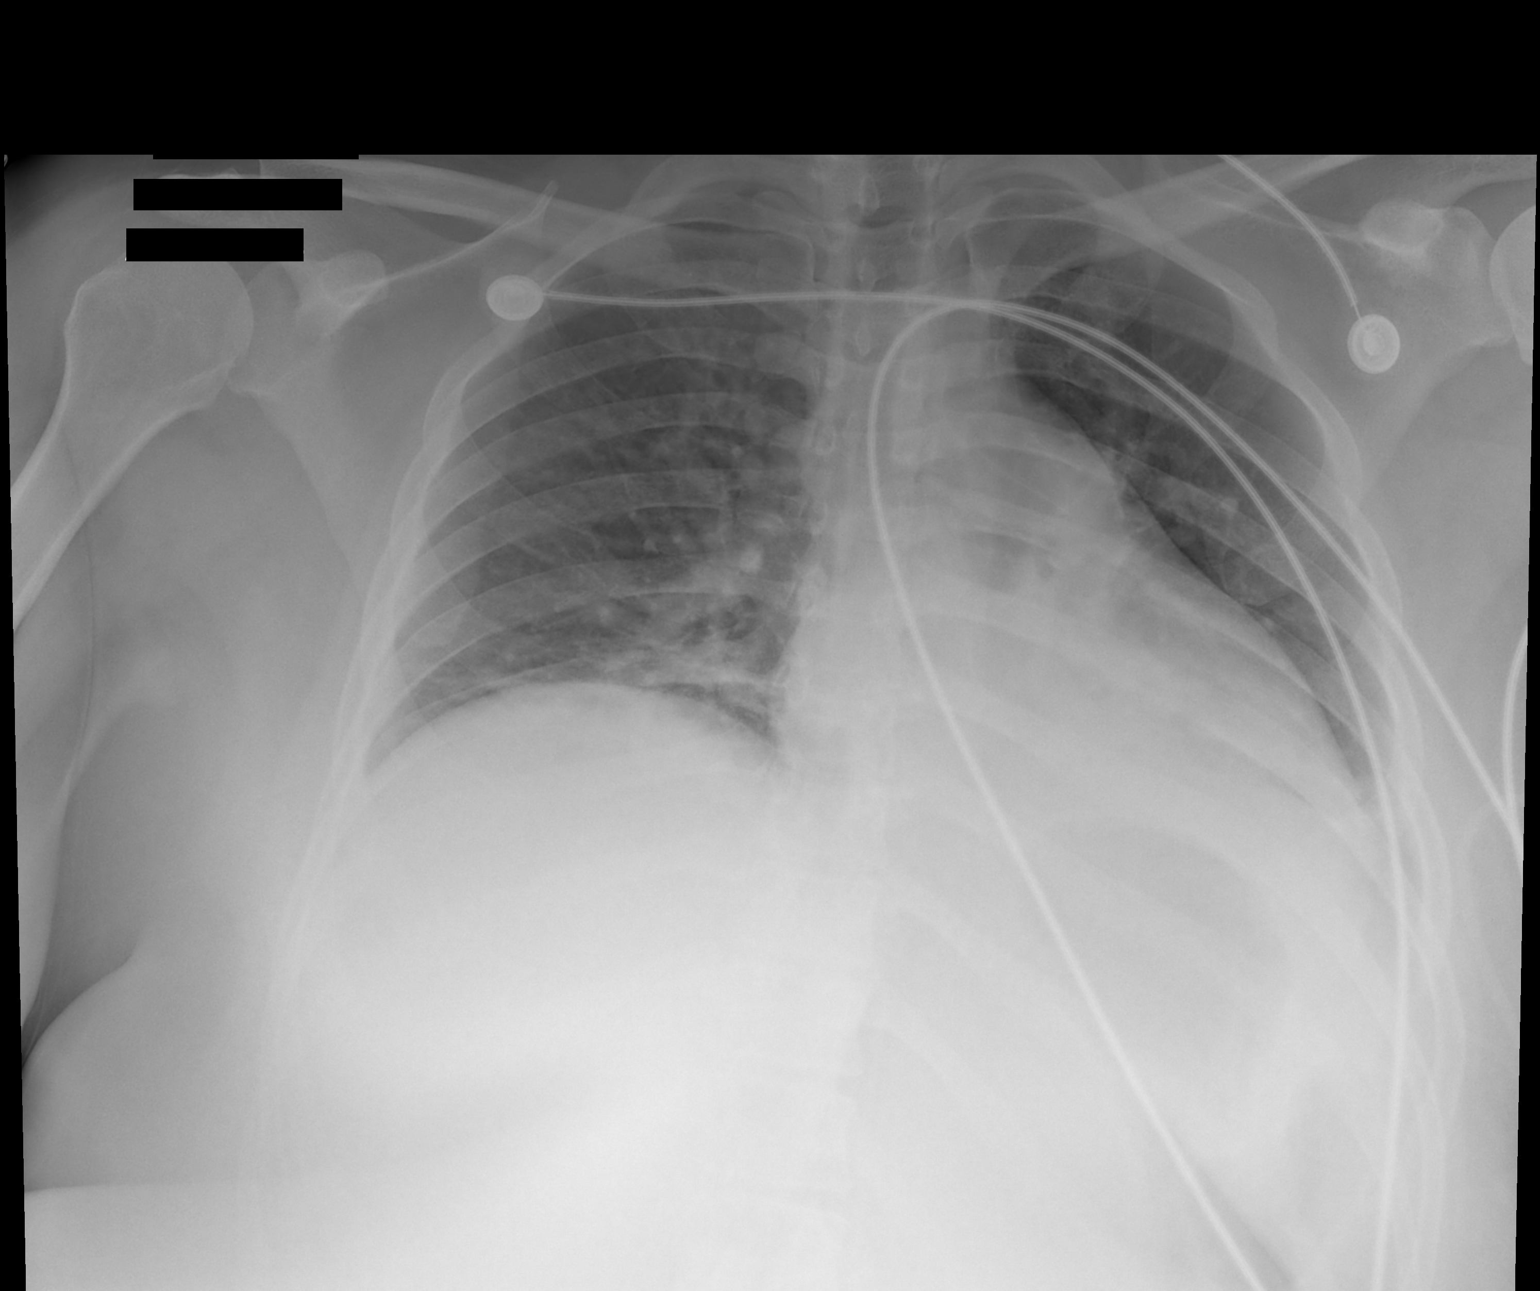

[1 of 1 positions shown; findings below may reference images not displayed]

FINDINGS: There are low lung volumes with bibasilar atelectasis.
Heart is upper limits normal in size, accentuated by the low
volumes.  No effusions or acute bony abnormality.
IMPRESSION: Low lung volumes, bibasilar atelectasis.

## 2014-01-26 LAB — OB RESULTS CONSOLE ABO/RH: RH Type: POSITIVE

## 2014-01-26 LAB — OB RESULTS CONSOLE HEPATITIS B SURFACE ANTIGEN: Hepatitis B Surface Ag: NEGATIVE

## 2014-01-26 LAB — OB RESULTS CONSOLE RUBELLA ANTIBODY, IGM: Rubella: IMMUNE

## 2014-01-26 LAB — OB RESULTS CONSOLE ANTIBODY SCREEN: ANTIBODY SCREEN: NEGATIVE

## 2014-01-26 LAB — OB RESULTS CONSOLE RPR: RPR: NONREACTIVE

## 2014-02-02 ENCOUNTER — Inpatient Hospital Stay (HOSPITAL_COMMUNITY): Payer: Medicaid Other

## 2014-02-02 ENCOUNTER — Inpatient Hospital Stay (HOSPITAL_COMMUNITY)
Admission: AD | Admit: 2014-02-02 | Discharge: 2014-02-02 | Disposition: A | Discharge status: Home / Self Care | Payer: Medicaid Other | Source: Ambulatory Visit | Attending: Obstetrics & Gynecology | Admitting: Obstetrics & Gynecology

## 2014-02-02 ENCOUNTER — Encounter (HOSPITAL_COMMUNITY): Payer: Self-pay | Admitting: *Deleted

## 2014-02-02 DIAGNOSIS — O219 Vomiting of pregnancy, unspecified: Secondary | ICD-10-CM

## 2014-02-02 DIAGNOSIS — R109 Unspecified abdominal pain: Secondary | ICD-10-CM | POA: Diagnosis present

## 2014-02-02 DIAGNOSIS — O210 Mild hyperemesis gravidarum: Secondary | ICD-10-CM | POA: Insufficient documentation

## 2014-02-02 LAB — HIV ANTIBODY (ROUTINE TESTING W REFLEX): HIV: NONREACTIVE

## 2014-02-02 LAB — URINALYSIS, ROUTINE W REFLEX MICROSCOPIC
BILIRUBIN URINE: NEGATIVE
GLUCOSE, UA: NEGATIVE mg/dL
HGB URINE DIPSTICK: NEGATIVE
KETONES UR: NEGATIVE mg/dL
Leukocytes, UA: NEGATIVE
NITRITE: NEGATIVE
PH: 7 (ref 5.0–8.0)
Protein, ur: NEGATIVE mg/dL
SPECIFIC GRAVITY, URINE: 1.015 (ref 1.005–1.030)
Urobilinogen, UA: 0.2 mg/dL (ref 0.0–1.0)

## 2014-02-02 LAB — CBC
HCT: 36.4 % (ref 36.0–46.0)
HEMOGLOBIN: 12 g/dL (ref 12.0–15.0)
MCH: 28.5 pg (ref 26.0–34.0)
MCHC: 33 g/dL (ref 30.0–36.0)
MCV: 86.5 fL (ref 78.0–100.0)
Platelets: 226 10*3/uL (ref 150–400)
RBC: 4.21 MIL/uL (ref 3.87–5.11)
RDW: 14.3 % (ref 11.5–15.5)
WBC: 11.9 10*3/uL — ABNORMAL HIGH (ref 4.0–10.5)

## 2014-02-02 LAB — WET PREP, GENITAL
Trich, Wet Prep: NONE SEEN
Yeast Wet Prep HPF POC: NONE SEEN

## 2014-02-02 LAB — HCG, QUANTITATIVE, PREGNANCY: hCG, Beta Chain, Quant, S: 74823 m[IU]/mL — ABNORMAL HIGH (ref <5)

## 2014-02-02 LAB — POCT PREGNANCY, URINE: Preg Test, Ur: POSITIVE — AB

## 2014-02-02 MED ORDER — METOCLOPRAMIDE HCL 10 MG PO TABS: 10.0000 mg | ORAL_TABLET | Freq: Three times a day (TID) | ORAL | Status: DC | PRN

## 2014-02-02 NOTE — MAU Provider Note (Signed)
History     CSN: [id number]  Arrival date and time: 02/02/14 1914   First Provider Initiated Contact with Patient 02/02/14 0932      Chief Complaint  Patient presents with  . Abdominal Cramping   HPI [name] 28 y.o. G2P1001  presents to MAU complaining of abdominal cramping, back pain, nausea and vomiting.  For the last 2+ months she was feeling like her period was about to start but that never happened.  She went to her PCP 2 weeks ago and learned she was pregnant.  The cramping is located across her lower abdomen and is getting worse.  She states it is 8/10 and feels like a pulling sensation.  She has not noticed aggravating or alleviating factors.  She has not used any medications.  She experiences nausea all day and has only vomited once during this pregnancy.  She had vaginal bleeding like a period 3 weeks ago but only one day.  She denies vaginal bleeding now, no leaking of fluid/vaginal discharge/dysuria.   OB History   Grav Para Term Preterm Abortions TAB SAB Ect Mult Living   Past Medical History  Diagnosis Date  . Depression   . Kidney stones     Past Surgical History  Procedure Laterality Date  . Fracture surgery  2006    ankle  . Lithotripsy  2008  . Wisdome teeth removal      Family History  Problem Relation Age of Onset  . Cancer Maternal Aunt     breast; 3 aunts deceased & 1 living  . Cancer Maternal Grandmother 40    breast  . Alcohol abuse Maternal Grandfather   . Heart disease Paternal Grandfather     MI  . Heart disease Other     MI    History  Substance Use Topics  . Smoking status: Never Smoker   . Smokeless tobacco: Never Used  . Alcohol Use: No    Allergies: No Known Allergies  Prescriptions prior to admission  Medication Sig Dispense Refill  . Prenatal Vit-Fe Fumarate-FA (PRENATAL MULTIVITAMIN) TABS tablet Take 1 tablet by mouth daily at 12 noon.        Review of Systems  Constitutional: Negative  for fever, chills, weight loss and diaphoresis.  HENT: Negative for congestion and sore throat.   Eyes: Negative for blurred vision and double vision.  Respiratory: Positive for shortness of breath. Negative for cough and wheezing.   Cardiovascular: Negative for chest pain and palpitations.  Gastrointestinal: Positive for abdominal pain and constipation. Negative for heartburn, nausea, vomiting and diarrhea.  Genitourinary: Positive for frequency. Negative for dysuria and hematuria.  Skin: Negative for rash.  Neurological: Positive for dizziness, weakness and headaches. Negative for tingling.  Psychiatric/Behavioral: Negative for depression, suicidal ideas and substance abuse. The patient is not nervous/anxious.    Physical Exam   Blood pressure 107/62, pulse 84, temperature 98.2 F (36.8 C), temperature source Oral, height  (1.6 m), weight 233 lb (105.688 kg), last menstrual period 11/02/2013.  Physical Exam  Constitutional: She is oriented to person, place, and time. She appears well-developed and well-nourished.  HENT:  Head: Normocephalic and atraumatic.  Eyes: EOM are normal.  Neck: Normal range of motion.  Cardiovascular: Normal rate, regular rhythm and normal heart sounds.   Respiratory: Effort normal. No respiratory distress.  GI: Soft. Bowel sounds are normal. She exhibits no distension. There is no  tenderness.  Large pannus  Genitourinary:  Vagina with moderate amt of mucus like discharge No CMT/No adnexal tenderness or mass  Musculoskeletal: Normal range of motion.  Neurological: She is alert and oriented to person, place, and time.  Skin: Skin is warm and dry.  Psychiatric: She has a normal mood and affect.   Results for orders placed during the hospital encounter of 02/02/14 (from the past 24 hour(s))  URINALYSIS, ROUTINE W REFLEX MICROSCOPIC     Status: None   Collection Time    02/02/14  8:43 AM      Result Value Ref Range   Color, Urine YELLOW  YELLOW    APPearance CLEAR  CLEAR   Specific Gravity, Urine 1.015  1.005 - 1.030   pH 7.0  5.0 - 8.0   Glucose, UA NEGATIVE  NEGATIVE mg/dL   Hgb urine dipstick NEGATIVE  NEGATIVE   Bilirubin Urine NEGATIVE  NEGATIVE   Ketones, ur NEGATIVE  NEGATIVE mg/dL   Protein, ur NEGATIVE  NEGATIVE mg/dL   Urobilinogen, UA 0.2  0.0 - 1.0 mg/dL   Nitrite NEGATIVE  NEGATIVE   Leukocytes, UA NEGATIVE  NEGATIVE  POCT PREGNANCY, URINE     Status: Abnormal   Collection Time    02/02/14  8:54 AM      Result Value Ref Range   Preg Test, Ur POSITIVE (*) NEGATIVE  CBC     Status: Abnormal   Collection Time    02/02/14  9:54 AM      Result Value Ref Range   WBC 11.9 (*) 4.0 - 10.5 K/uL   RBC 4.21  3.87 - 5.11 MIL/uL   Hemoglobin 12.0  12.0 - 15.0 g/dL   HCT 81.1  91.4 - 78.2 %   MCV 86.5  78.0 - 100.0 fL   MCH 28.5  26.0 - 34.0 pg   MCHC 33.0  30.0 - 36.0 g/dL   RDW 95.6  21.3 - 08.6 %   Platelets 226  150 - 400 K/uL  HCG, QUANTITATIVE, PREGNANCY     Status: Abnormal   Collection Time    02/02/14  9:54 AM      Result Value Ref Range   hCG, Beta Chain, Quant, [place] [address] (*) <5 mIU/mL  WET PREP, GENITAL     Status: Abnormal   Collection Time    02/02/14  9:54 AM      Result Value Ref Range   Yeast Wet Prep HPF POC NONE SEEN  NONE SEEN   Trich, Wet Prep NONE SEEN  NONE SEEN   Clue Cells Wet Prep HPF POC FEW (*) NONE SEEN   WBC, Wet Prep HPF POC FEW (*) NONE SEEN   US Ob Comp Less 14 Wks  02/02/2014   CLINICAL DATA:  Pelvic pain.  EXAM: OBSTETRIC <14 WK [place] AND TRANSVAGINAL OB US  TECHNIQUE: Both transabdominal and transvaginal ultrasound examinations were performed for complete evaluation of the gestation as well as the maternal uterus, adnexal regions, and pelvic cul-de-sac. Transvaginal technique was performed to assess early pregnancy.  COMPARISON:  None.  FINDINGS: Intrauterine gestational sac: Single.  Yolk sac:  Yes  Embryo:  Gas  Cardiac Activity: Yes  Heart Rate:  144 bpm  CRL:   8  mm   6 w 6 d                   [place] EDC: 09/22/2014  Maternal uterus/adnexae: There is a small subchorionic hemorrhage. Ovaries are normal. No free  fluid.  IMPRESSION: Single intrauterine pregnancy of approximately 6 weeks 6 days gestation. Small subchorionic hemorrhage.   Electronically Signed   By: Geanie Cooley M.D.   On: 02/02/2014 11:45   US Ob Transvaginal  02/02/2014   CLINICAL DATA:  Pelvic pain.  EXAM: OBSTETRIC <14 WK [place] AND TRANSVAGINAL OB US  TECHNIQUE: Both transabdominal and transvaginal ultrasound examinations were performed for complete evaluation of the gestation as well as the maternal uterus, adnexal regions, and pelvic cul-de-sac. Transvaginal technique was performed to assess early pregnancy.  COMPARISON:  None.  FINDINGS: Intrauterine gestational sac: Single.  Yolk sac:  Yes  Embryo:  Gas  Cardiac Activity: Yes  Heart Rate:  144 bpm  CRL:   8  mm   6 w 6 d                  [place] EDC: 09/22/2014  Maternal uterus/adnexae: There is a small subchorionic hemorrhage. Ovaries are normal. No free fluid.  IMPRESSION: Single intrauterine pregnancy of approximately 6 weeks 6 days gestation. Small subchorionic hemorrhage.   Electronically Signed   By: Geanie Cooley M.D.   On: 02/02/2014 11:45    MAU Course  Procedures  MDM IUP per U/S.  Symptoms consistent with nausea and vomiting in pregnancy  Assessment and Plan  A: nausea and vomiting in pregnancy  P: Discharge to home Reglan  po up to TID for nausea Keep appt for [facility] Return to MAU for emergency  Bertram Denver 02/02/2014, 9:33 AM

## 2014-02-02 NOTE — MAU Note (Signed)
Pt states she has been feeling lower abdominal cramping and lower back pain for two months. Pt states she went to her doctors office [facility]) two weeks ago for her annual and was told she was pregnant. Pt states she did not know she was pregnant before that.

## 2014-02-02 NOTE — MAU Note (Addendum)
Pt states she had been SOB with exertion for the past month, getting progressively worse.  O2 sat 100%.  Respirations even & unlabored.

## 2014-02-02 NOTE — Discharge Instructions (Signed)

## 2014-02-03 LAB — GC/CHLAMYDIA PROBE AMP
CT Probe RNA: NEGATIVE
GC PROBE AMP APTIMA: NEGATIVE

## 2014-02-04 NOTE — MAU Provider Note (Signed)
Attestation of Attending Supervision of Advanced Practitioner (CNM/NP): Evaluation and management procedures were performed by the Advanced Practitioner under my supervision and collaboration. I have reviewed the Advanced Practitioner's note and chart, and I agree with the management and plan.  Reniah Cottingham H. 12:19 PM

## 2014-02-08 ENCOUNTER — Encounter: Payer: BC Managed Care – PPO | Admitting: Obstetrics

## 2014-02-16 ENCOUNTER — Inpatient Hospital Stay (HOSPITAL_COMMUNITY)
Admission: AD | Admit: 2014-02-16 | Discharge: 2014-02-16 | Discharge status: Against Medical Advice | Payer: Medicaid Other | Source: Ambulatory Visit | Attending: Obstetrics & Gynecology | Admitting: Obstetrics & Gynecology

## 2014-02-16 DIAGNOSIS — O210 Mild hyperemesis gravidarum: Secondary | ICD-10-CM | POA: Diagnosis present

## 2014-02-16 DIAGNOSIS — R109 Unspecified abdominal pain: Secondary | ICD-10-CM | POA: Diagnosis not present

## 2014-02-16 DIAGNOSIS — Z3A08 8 weeks gestation of pregnancy: Secondary | ICD-10-CM | POA: Diagnosis not present

## 2014-02-16 LAB — URINALYSIS, ROUTINE W REFLEX MICROSCOPIC
BILIRUBIN URINE: NEGATIVE
Glucose, UA: NEGATIVE mg/dL
Hgb urine dipstick: NEGATIVE
KETONES UR: 15 mg/dL — AB
Leukocytes, UA: NEGATIVE
NITRITE: NEGATIVE
PH: 6 (ref 5.0–8.0)
Protein, ur: NEGATIVE mg/dL
Specific Gravity, Urine: 1.02 (ref 1.005–1.030)
UROBILINOGEN UA: 1 mg/dL (ref 0.0–1.0)

## 2014-02-16 NOTE — MAU Note (Signed)
Pt called, not in lobby 

## 2014-02-16 NOTE — MAU Note (Signed)
Vomiting started a week ago, taking diclegis but is not helping, thinks she may be dehydrated.  Was advised to come to MAU by MD office.  Lower abd cramping, also legs.  Denies bleeding.

## 2014-02-16 NOTE — MAU Note (Signed)
Pt called, but not in waiting area at this time.

## 2014-02-16 NOTE — MAU Note (Signed)
Pt called, not in lobby.  Did not inform staff she was leaving. 

## 2014-02-17 ENCOUNTER — Inpatient Hospital Stay (HOSPITAL_COMMUNITY)
Admission: AD | Admit: 2014-02-17 | Discharge: 2014-02-17 | Disposition: A | Discharge status: Home / Self Care | Payer: Medicaid Other | Source: Ambulatory Visit | Attending: Obstetrics and Gynecology | Admitting: Obstetrics and Gynecology

## 2014-02-17 ENCOUNTER — Encounter (HOSPITAL_COMMUNITY): Payer: Self-pay

## 2014-02-17 DIAGNOSIS — O99611 Diseases of the digestive system complicating pregnancy, first trimester: Secondary | ICD-10-CM | POA: Insufficient documentation

## 2014-02-17 DIAGNOSIS — Z3A22 22 weeks gestation of pregnancy: Secondary | ICD-10-CM

## 2014-02-17 DIAGNOSIS — K117 Disturbances of salivary secretion: Secondary | ICD-10-CM | POA: Diagnosis not present

## 2014-02-17 DIAGNOSIS — R12 Heartburn: Secondary | ICD-10-CM | POA: Diagnosis not present

## 2014-02-17 DIAGNOSIS — O26891 Other specified pregnancy related conditions, first trimester: Secondary | ICD-10-CM

## 2014-02-17 DIAGNOSIS — R1013 Epigastric pain: Secondary | ICD-10-CM | POA: Insufficient documentation

## 2014-02-17 DIAGNOSIS — Z3A09 9 weeks gestation of pregnancy: Secondary | ICD-10-CM | POA: Diagnosis not present

## 2014-02-17 DIAGNOSIS — O219 Vomiting of pregnancy, unspecified: Secondary | ICD-10-CM

## 2014-02-17 DIAGNOSIS — O210 Mild hyperemesis gravidarum: Secondary | ICD-10-CM | POA: Diagnosis present

## 2014-02-17 LAB — URINALYSIS, ROUTINE W REFLEX MICROSCOPIC
BILIRUBIN URINE: NEGATIVE
GLUCOSE, UA: NEGATIVE mg/dL
HGB URINE DIPSTICK: NEGATIVE
Ketones, ur: NEGATIVE mg/dL
Leukocytes, UA: NEGATIVE
Nitrite: NEGATIVE
Protein, ur: NEGATIVE mg/dL
UROBILINOGEN UA: 1 mg/dL (ref 0.0–1.0)
pH: 6 (ref 5.0–8.0)

## 2014-02-17 MED ORDER — GLYCOPYRROLATE 1 MG PO TABS
1.0000 mg | ORAL_TABLET | Freq: Once | ORAL | Status: AC
  Administered 2014-02-17: 1 mg via ORAL
  Filled 2014-02-17: qty 1

## 2014-02-17 MED ORDER — PROMETHAZINE HCL 12.5 MG PO TABS: 12.5000 mg | ORAL_TABLET | Freq: Four times a day (QID) | ORAL | Status: DC | PRN

## 2014-02-17 MED ORDER — PROMETHAZINE HCL 25 MG PO TABS
25.0000 mg | ORAL_TABLET | Freq: Once | ORAL | Status: AC
  Administered 2014-02-17: 25 mg via ORAL
  Filled 2014-02-17: qty 1

## 2014-02-17 MED ORDER — GLYCOPYRROLATE 2 MG PO TABS: 2.0000 mg | ORAL_TABLET | Freq: Three times a day (TID) | ORAL | Status: DC

## 2014-02-17 NOTE — MAU Provider Note (Signed)
Chief Complaint: Emesis During Pregnancy and Abdominal Pain   First Provider Initiated Contact with Patient 02/17/14 1609     SUBJECTIVE HPI: [name] is a 28 y.o. G2P1001 at 3445w0d by LMP who presents with nausea with vomiting about 3 times a day and spitting continually. Retained orange juice today, no solids. Also having heartburn and epigastric pain.  Taking Diclegis and Pepcid but neither are helping.   Pregnancy Course   [facility]  Past Medical History  Diagnosis Date  . Depression   . Kidney stones    OB History  Gravida Para Term Preterm AB SAB TAB Ectopic Multiple Living  2 1 1       1     # Outcome Date GA Lbr Len/2nd Weight Sex Delivery Anes PTL Lv  2 CUR           1 TRM      SVD   Y     Past Surgical History  Procedure Laterality Date  . Fracture surgery  2006    ankle  . Lithotripsy  2008  . Wisdome teeth removal     History   Social History  . Marital Status: Single    Spouse Name: N/A    Number of Children: 1  . Years of Education: N/A   Occupational History  . CUSTOMER SERVICE [employer]   Social History Main Topics  . Smoking status: Never Smoker   . Smokeless tobacco: Never Used  . Alcohol Use: No  . Drug Use: No  . Sexual Activity: Yes    Birth Control/ Protection: None   Other Topics Concern  . Not on file   Social History Narrative   Regular exercise: no   No current facility-administered medications on file prior to encounter.   Current Outpatient Prescriptions on File Prior to Encounter  Medication Sig Dispense Refill  . metoCLOPramide (REGLAN) 10 MG tablet Take 1 tablet (10 mg total) by mouth 3 (three) times daily as needed for nausea.  20 tablet  0  . Prenatal Vit-Fe Fumarate-FA (PRENATAL MULTIVITAMIN) TABS tablet Take 1 tablet by mouth daily at 12 noon.       No Known Allergies  ROS: Pertinent items in HPI. Denies dysuria, urgency. No fever, diarrhea, constipation.  OBJECTIVE Blood pressure 115/78, pulse 93,  temperature 99.4 F (37.4 C), temperature source Oral, resp. rate 16, height 5\' 3"  (1.6 m), weight 102.15 kg (225 lb 3.2 oz), last menstrual period 11/02/2013, SpO2 100.00%. GENERAL: Well-developed, well-nourished female looks fatigued, spitting into bag HEENT: Normocephalic HEART: normal rate RESP: normal effort ABDOMEN: Soft, non-tender EXTREMITIES: Nontender, no edema NEURO: Alert and oriented   LAB RESULTS Results for orders placed during the hospital encounter of 02/17/14 (from the past 24 hour(s))  URINALYSIS, ROUTINE W REFLEX MICROSCOPIC     Status: Abnormal   Collection Time    02/17/14  3:35 PM      Result Value Ref Range   Color, Urine YELLOW  YELLOW   APPearance CLEAR  CLEAR   Specific Gravity, Urine >1.030 (*) 1.005 - 1.030   pH 6.0  5.0 - 8.0   Glucose, UA NEGATIVE  NEGATIVE mg/dL   Hgb urine dipstick NEGATIVE  NEGATIVE   Bilirubin Urine NEGATIVE  NEGATIVE   Ketones, ur NEGATIVE  NEGATIVE mg/dL   Protein, ur NEGATIVE  NEGATIVE mg/dL   Urobilinogen, UA 1.0  0.0 - 1.0 mg/dL   Nitrite NEGATIVE  NEGATIVE   Leukocytes, UA NEGATIVE  NEGATIVE  IMAGING US Ob Comp Less 14 Wks  02/02/2014   CLINICAL DATA:  Pelvic pain.  EXAM: OBSTETRIC <14 WK [place] AND TRANSVAGINAL OB US  TECHNIQUE: Both transabdominal and transvaginal ultrasound examinations were performed for complete evaluation of the gestation as well as the maternal uterus, adnexal regions, and pelvic cul-de-sac. Transvaginal technique was performed to assess early pregnancy.  COMPARISON:  None.  FINDINGS: Intrauterine gestational sac: Single.  Yolk sac:  Yes  Embryo:  Gas  Cardiac Activity: Yes  Heart Rate:  144 bpm  CRL:   8  mm   6 w 6 d                  [place] EDC: 09/22/2014  Maternal uterus/adnexae: There is a small subchorionic hemorrhage. Ovaries are normal. No free fluid.  IMPRESSION: Single intrauterine pregnancy of approximately 6 weeks 6 days gestation. Small subchorionic hemorrhage.   Electronically Signed   By: Geanie Cooley M.D.   On: 02/02/2014 11:45   US Ob Transvaginal  02/02/2014   CLINICAL DATA:  Pelvic pain.  EXAM: OBSTETRIC <14 WK [place] AND TRANSVAGINAL OB US  TECHNIQUE: Both transabdominal and transvaginal ultrasound examinations were performed for complete evaluation of the gestation as well as the maternal uterus, adnexal regions, and pelvic cul-de-sac. Transvaginal technique was performed to assess early pregnancy.  COMPARISON:  None.  FINDINGS: Intrauterine gestational sac: Single.  Yolk sac:  Yes  Embryo:  Gas  Cardiac Activity: Yes  Heart Rate:  144 bpm  CRL:   8  mm   6 w 6 d                  [place] EDC: 09/22/2014  Maternal uterus/adnexae: There is a small subchorionic hemorrhage. Ovaries are normal. No free fluid.  IMPRESSION: Single intrauterine pregnancy of approximately 6 weeks 6 days gestation. Small subchorionic hemorrhage.   Electronically Signed   By: Geanie Cooley M.D.   On: 02/02/2014 11:45    MAU COURSE Phenergan 25mg  po; Robinul 1 mg po given  ASSESSMENT 1. Nausea and vomiting during pregnancy prior to [redacted] weeks gestation   2. Ptyalism   3. Heartburn during pregnancy in first trimester   G2P1001 at [redacted]w[redacted]d  PLAN Discharge home    Medication List         famotidine 20 MG tablet  Commonly known as:  PEPCID  Take 20 mg by mouth 2 (two) times daily.     glycopyrrolate 2 MG tablet  Commonly known as:  ROBINUL-FORTE  Take 1 tablet (2 mg total) by mouth 3 (three) times daily.     prenatal multivitamin Tabs tablet  Take 1 tablet by mouth daily at 12 noon.     promethazine 12.5 MG tablet  Commonly known as:  PHENERGAN  Take 1 tablet (12.5 mg total) by mouth every 6 (six) hours as needed for nausea or vomiting.       Follow-up Information   Follow up with Philip Aspen, DO. (Keep your scheduled prenatal appointment)    Specialty:  Obstetrics and Gynecology   Contact information:   [address] [place] [address] [phone]       Follow up On  03/11/2014.      Danae Orleans, CNM 02/17/2014  4:10 PM

## 2014-02-17 NOTE — Discharge Instructions (Signed)

## 2014-02-17 NOTE — MAU Note (Signed)
Patient states she has had nausea and vomiting for about one week. Has mid abdominal pain all the time. Denies bleeding or discharge.

## 2014-02-24 ENCOUNTER — Encounter: Payer: BC Managed Care – PPO | Admitting: Obstetrics

## 2014-02-25 ENCOUNTER — Ambulatory Visit: Payer: Self-pay | Admitting: Obstetrics

## 2014-03-15 ENCOUNTER — Encounter (HOSPITAL_COMMUNITY): Payer: Self-pay

## 2014-05-14 NOTE — L&D Delivery Note (Signed)
Patient was C/C/+3 and pushed for 5 minutes with epidural.   NSVD  female infant, Apgars 8,9, weight p.   The patient had one first degree midline laceration repaired with 2-0 vicryl. Fundus was firm. EBL was expected amount. Placenta was delivered intact. Vagina was clear.  Baby was vigorous and doing skin to skin with mother.  Pt desires BTL but had pregnancy medicaid- did not sign papers yet.  Will need to do at 6 week pp.  Allenmichael Mcpartlin A

## 2014-06-23 ENCOUNTER — Other Ambulatory Visit: Payer: Self-pay

## 2014-06-23 LAB — OB RESULTS CONSOLE GC/CHLAMYDIA
CHLAMYDIA, DNA PROBE: NEGATIVE
Gonorrhea: NEGATIVE

## 2014-08-05 ENCOUNTER — Other Ambulatory Visit: Payer: Self-pay | Admitting: Obstetrics and Gynecology

## 2014-08-19 ENCOUNTER — Other Ambulatory Visit: Payer: Self-pay | Admitting: Obstetrics and Gynecology

## 2014-09-08 ENCOUNTER — Encounter (HOSPITAL_COMMUNITY): Payer: Self-pay | Admitting: *Deleted

## 2014-09-08 ENCOUNTER — Inpatient Hospital Stay (HOSPITAL_COMMUNITY)
Admission: AD | Admit: 2014-09-08 | Discharge: 2014-09-08 | Disposition: A | Discharge status: Home / Self Care | Payer: Medicaid Other | Source: Ambulatory Visit | Attending: Obstetrics and Gynecology | Admitting: Obstetrics and Gynecology

## 2014-09-08 DIAGNOSIS — R109 Unspecified abdominal pain: Secondary | ICD-10-CM | POA: Diagnosis not present

## 2014-09-08 DIAGNOSIS — O9989 Other specified diseases and conditions complicating pregnancy, childbirth and the puerperium: Secondary | ICD-10-CM | POA: Diagnosis not present

## 2014-09-08 DIAGNOSIS — Z3A38 38 weeks gestation of pregnancy: Secondary | ICD-10-CM | POA: Diagnosis not present

## 2014-09-08 DIAGNOSIS — M545 Low back pain: Secondary | ICD-10-CM | POA: Insufficient documentation

## 2014-09-08 NOTE — Discharge Instructions (Signed)
Braxton Hicks Contractions °Contractions of the uterus can occur throughout pregnancy. Contractions are not always a sign that you are in labor.  °WHAT ARE BRAXTON HICKS CONTRACTIONS?  °Contractions that occur before labor are called Braxton Hicks contractions, or false labor. Toward the end of pregnancy (32-34 weeks), these contractions can develop more often and may become more forceful. This is not true labor because these contractions do not result in opening (dilatation) and thinning of the cervix. They are sometimes difficult to tell apart from true labor because these contractions can be forceful and people have different pain tolerances. You should not feel embarrassed if you go to the hospital with false labor. Sometimes, the only way to tell if you are in true labor is for your health care provider to look for changes in the cervix. °If there are no prenatal problems or other health problems associated with the pregnancy, it is completely safe to be sent home with false labor and await the onset of true labor. °HOW CAN YOU TELL THE DIFFERENCE BETWEEN TRUE AND FALSE LABOR? °False Labor °· The contractions of false labor are usually shorter and not as hard as those of true labor.   °· The contractions are usually irregular.   °· The contractions are often felt in the front of the lower abdomen and in the groin.   °· The contractions may go away when you walk around or change positions while lying down.   °· The contractions get weaker and are shorter lasting as time goes on.   °· The contractions do not usually become progressively stronger, regular, and closer together as with true labor.   °True Labor °· Contractions in true labor last 30-70 seconds, become very regular, usually become more intense, and increase in frequency.   °· The contractions do not go away with walking.   °· The discomfort is usually felt in the top of the uterus and spreads to the lower abdomen and low back.   °· True labor can be  determined by your health care provider with an exam. This will show that the cervix is dilating and getting thinner.   °WHAT TO REMEMBER °· Keep up with your usual exercises and follow other instructions given by your health care provider.   °· Take medicines as directed by your health care provider.   °· Keep your regular prenatal appointments.   °· Eat and drink lightly if you think you are going into labor.   °· If Braxton Hicks contractions are making you uncomfortable:   °¨ Change your position from lying down or resting to walking, or from walking to resting.   °¨ Sit and rest in a tub of warm water.   °¨ Drink 2-3 glasses of water. Dehydration may cause these contractions.   °¨ Do slow and deep breathing several times an hour.   °WHEN SHOULD I SEEK IMMEDIATE MEDICAL CARE? °Seek immediate medical care if: °· Your contractions become stronger, more regular, and closer together.   °· You have fluid leaking or gushing from your vagina.   °· You have a fever.    °· You have vaginal bleeding.   °· You have continuous abdominal pain.   °· You have low back pain that you never had before.   °· You feel your baby's head pushing down and causing pelvic pressure.   °· Your baby is not moving as much as it used to.   °Document Released: 04/30/2005 Document Revised: 05/05/2013 Document Reviewed: 02/09/2013 °ExitCare® Patient Information ©2015 [employer]. This information is not intended to replace advice given to you by your health care provider. Make sure you discuss any   questions you have with your health care provider. ° °

## 2014-09-08 NOTE — MAU Note (Signed)
Pt C/O lower abd, L mid side & lower back pain that started yesterday, becomes tight, comes & goes, unsure if uc's.  Denies bleeding or LOF.

## 2014-09-14 ENCOUNTER — Inpatient Hospital Stay (HOSPITAL_COMMUNITY)
Admission: RE | Admit: 2014-09-14 | Discharge: 2014-09-16 | DRG: 775 | Disposition: A | Discharge status: Home / Self Care | Payer: Medicaid Other | Source: Ambulatory Visit | Attending: Obstetrics and Gynecology | Admitting: Obstetrics and Gynecology

## 2014-09-14 ENCOUNTER — Encounter (HOSPITAL_COMMUNITY): Payer: Self-pay | Admitting: *Deleted

## 2014-09-14 ENCOUNTER — Inpatient Hospital Stay (HOSPITAL_COMMUNITY): Payer: Medicaid Other | Admitting: Anesthesiology

## 2014-09-14 DIAGNOSIS — Z3A39 39 weeks gestation of pregnancy: Secondary | ICD-10-CM | POA: Diagnosis present

## 2014-09-14 DIAGNOSIS — Z3483 Encounter for supervision of other normal pregnancy, third trimester: Secondary | ICD-10-CM | POA: Diagnosis present

## 2014-09-14 LAB — OB RESULTS CONSOLE GBS: GBS: NEGATIVE

## 2014-09-14 LAB — TYPE AND SCREEN
ABO/RH(D): O POS
ANTIBODY SCREEN: NEGATIVE

## 2014-09-14 LAB — CBC
HEMATOCRIT: 31.9 % — AB (ref 36.0–46.0)
Hemoglobin: 10 g/dL — ABNORMAL LOW (ref 12.0–15.0)
MCH: 25.3 pg — ABNORMAL LOW (ref 26.0–34.0)
MCHC: 31.3 g/dL (ref 30.0–36.0)
MCV: 80.6 fL (ref 78.0–100.0)
PLATELETS: 226 10*3/uL (ref 150–400)
RBC: 3.96 MIL/uL (ref 3.87–5.11)
RDW: 15.8 % — ABNORMAL HIGH (ref 11.5–15.5)
WBC: 13 10*3/uL — ABNORMAL HIGH (ref 4.0–10.5)

## 2014-09-14 LAB — MRSA PCR SCREENING: MRSA BY PCR: NEGATIVE

## 2014-09-14 MED ORDER — OXYCODONE-ACETAMINOPHEN 5-325 MG PO TABS: 2.0000 | ORAL_TABLET | ORAL | Status: DC | PRN

## 2014-09-14 MED ORDER — LACTATED RINGERS IV SOLN
500.0000 mL | INTRAVENOUS | Status: DC | PRN
  Administered 2014-09-14: 500 mL via INTRAVENOUS

## 2014-09-14 MED ORDER — LIDOCAINE HCL (PF) 1 % IJ SOLN
INTRAMUSCULAR | Status: DC | PRN
  Administered 2014-09-14: 10 mL

## 2014-09-14 MED ORDER — CITRIC ACID-SODIUM CITRATE 334-500 MG/5ML PO SOLN: 30.0000 mL | ORAL | Status: DC | PRN

## 2014-09-14 MED ORDER — FENTANYL 2.5 MCG/ML BUPIVACAINE 1/10 % EPIDURAL INFUSION (WH - ANES): 14.0000 mL/h | INTRAMUSCULAR | Status: DC | PRN

## 2014-09-14 MED ORDER — ONDANSETRON HCL 4 MG/2ML IJ SOLN: 4.0000 mg | Freq: Four times a day (QID) | INTRAMUSCULAR | Status: DC | PRN

## 2014-09-14 MED ORDER — EPHEDRINE 5 MG/ML INJ
10.0000 mg | INTRAVENOUS | Status: DC | PRN
Start: 2014-09-14 — End: 2014-09-15
  Filled 2014-09-14: qty 2

## 2014-09-14 MED ORDER — ACETAMINOPHEN 325 MG PO TABS: 650.0000 mg | ORAL_TABLET | ORAL | Status: DC | PRN

## 2014-09-14 MED ORDER — OXYTOCIN 40 UNITS IN LACTATED RINGERS INFUSION - SIMPLE MED
1.0000 m[IU]/min | INTRAVENOUS | Status: DC
  Administered 2014-09-14: 2 m[IU]/min via INTRAVENOUS
  Filled 2014-09-14: qty 1000

## 2014-09-14 MED ORDER — OXYCODONE-ACETAMINOPHEN 5-325 MG PO TABS: 1.0000 | ORAL_TABLET | ORAL | Status: DC | PRN

## 2014-09-14 MED ORDER — LACTATED RINGERS IV SOLN
INTRAVENOUS | Status: DC
  Administered 2014-09-14 (×2): via INTRAVENOUS

## 2014-09-14 MED ORDER — OXYTOCIN 40 UNITS IN LACTATED RINGERS INFUSION - SIMPLE MED: 62.5000 mL/h | INTRAVENOUS | Status: DC

## 2014-09-14 MED ORDER — OXYTOCIN BOLUS FROM INFUSION
500.0000 mL | INTRAVENOUS | Status: DC
  Administered 2014-09-14: 500 mL via INTRAVENOUS

## 2014-09-14 MED ORDER — FLEET ENEMA 7-19 GM/118ML RE ENEM: 1.0000 | ENEMA | RECTAL | Status: DC | PRN

## 2014-09-14 MED ORDER — LIDOCAINE HCL (PF) 1 % IJ SOLN
30.0000 mL | INTRAMUSCULAR | Status: DC | PRN
  Filled 2014-09-14: qty 30

## 2014-09-14 MED ORDER — TERBUTALINE SULFATE 1 MG/ML IJ SOLN: 0.2500 mg | Freq: Once | INTRAMUSCULAR | Status: AC | PRN

## 2014-09-14 MED ORDER — DIPHENHYDRAMINE HCL 50 MG/ML IJ SOLN: 12.5000 mg | INTRAMUSCULAR | Status: DC | PRN

## 2014-09-14 MED ORDER — PHENYLEPHRINE 40 MCG/ML (10ML) SYRINGE FOR IV PUSH (FOR BLOOD PRESSURE SUPPORT)
80.0000 ug | PREFILLED_SYRINGE | INTRAVENOUS | Status: DC | PRN
  Filled 2014-09-14: qty 2
  Filled 2014-09-14: qty 20

## 2014-09-14 MED ORDER — FENTANYL 2.5 MCG/ML BUPIVACAINE 1/10 % EPIDURAL INFUSION (WH - ANES)
14.0000 mL/h | INTRAMUSCULAR | Status: DC | PRN
  Administered 2014-09-14: 14 mL/h via EPIDURAL
  Filled 2014-09-14: qty 125

## 2014-09-14 NOTE — H&P (Addendum)
29 y.o. 539w2d  G2P1001 comes in for elective induction at term.  Otherwise has good fetal movement and no bleeding.  Past Medical History  Diagnosis Date  . Depression   . Kidney stones     Past Surgical History  Procedure Laterality Date  . Fracture surgery  2006    ankle  . Lithotripsy  2008  . [place] teeth removal      OB History  Gravida Para Term Preterm AB SAB TAB Ectopic Multiple Living  2 1 1       1     # Outcome Date GA Lbr Len/2nd Weight Sex Delivery Anes PTL Lv  2 Current           1 Term      Vag-Spont   Y      History   Social History  . Marital Status: Single    Spouse Name: N/A  . Number of Children: 1  . Years of Education: N/A   Occupational History  . CUSTOMER SERVICE [employer]   Social History Main Topics  . Smoking status: Never Smoker   . Smokeless tobacco: Never Used  . Alcohol Use: No  . Drug Use: No  . Sexual Activity: Yes    Birth Control/ Protection: None   Other Topics Concern  . Not on file   Social History Narrative   Regular exercise: no   Review of patient's allergies indicates no known allergies.    Prenatal Transfer Tool  Maternal Diabetes: No Genetic Screening: Normal Maternal Ultrasounds/Referrals: Normal Fetal Ultrasounds or other Referrals:  None Maternal Substance Abuse:  No Significant Maternal Medications:  None Significant Maternal Lab Results: None  Other PNC: uncomplicated.    Filed Vitals:   09/14/14 0959  BP: 119/63  Pulse: 98  Temp:   Resp: 20     Lungs/Cor:  NAD Abdomen:  soft, gravid Ex:  no cords, erythema SVE:  4/C/-1, AROM unable to be done. FHTs:  120, good STV, NST R Toco:  qocc   A/P   Term desires induction.  GBS neg.  Cailan Antonucci A

## 2014-09-14 NOTE — Anesthesia Procedure Notes (Signed)
Epidural Patient location during procedure: OB Start time: 09/14/2014 2:48 PM End time: 09/14/2014 3:02 PM  Staffing Anesthesiologist: Sebastian AcheMANNY, Merranda Bolls Performed by: anesthesiologist   Preanesthetic Checklist Completed: patient identified, site marked, surgical consent, pre-op evaluation, timeout performed, IV checked, risks and benefits discussed and monitors and equipment checked  Epidural Patient position: sitting Prep: site prepped and draped and DuraPrep Patient monitoring: heart rate, continuous pulse ox and blood pressure Approach: midline Location: L3-L4 Injection technique: LOR air  Needle:  Needle type: Tuohy  Needle gauge: 17 G Needle length: 9 cm and 9 Needle insertion depth: 6 cm Catheter type: closed end flexible Catheter size: 19 Gauge Catheter at skin depth: 14 cm Test dose: negative  Assessment Events: blood not aspirated, injection not painful, no injection resistance, negative IV test and no paresthesia  Additional Notes   Patient tolerated the insertion well without complications.Reason for block:procedure for pain

## 2014-09-14 NOTE — Progress Notes (Signed)
Pt desires circ in hospital if possible.

## 2014-09-14 NOTE — Progress Notes (Signed)
6-7/70/-2 AROM clear NST R  Continue induction.

## 2014-09-14 NOTE — Progress Notes (Signed)
MRSA PCR result negative-contact precautions dc'd

## 2014-09-14 NOTE — Anesthesia Preprocedure Evaluation (Signed)
Anesthesia Evaluation  Patient identified by MRN, date of birth, ID band Patient awake and Patient confused    Reviewed: Allergy & Precautions, H&P , NPO status , Patient's Chart, lab work & pertinent test results  Airway Mallampati: II       Dental  (+) Teeth Intact   Pulmonary  breath sounds clear to auscultation  Pulmonary exam normal       Cardiovascular Exercise Tolerance: Good Normal cardiovascular examRhythm:regular Rate:Normal     Neuro/Psych    GI/Hepatic   Endo/Other    Renal/GU      Musculoskeletal   Abdominal   Peds  Hematology   Anesthesia Other Findings   Reproductive/Obstetrics (+) Pregnancy                             Anesthesia Physical Anesthesia Plan  ASA: II  Anesthesia Plan: Epidural   Post-op Pain Management:    Induction:   Airway Management Planned:   Additional Equipment:   Intra-op Plan:   Post-operative Plan:   Informed Consent: I have reviewed the patients History and Physical, chart, labs and discussed the procedure including the risks, benefits and alternatives for the proposed anesthesia with the patient or authorized representative who has indicated his/her understanding and acceptance.     Plan Discussed with:   Anesthesia Plan Comments:         Anesthesia Quick Evaluation

## 2014-09-15 LAB — CBC
HCT: 30.8 % — ABNORMAL LOW (ref 36.0–46.0)
HEMOGLOBIN: 9.7 g/dL — AB (ref 12.0–15.0)
MCH: 25.5 pg — ABNORMAL LOW (ref 26.0–34.0)
MCHC: 31.5 g/dL (ref 30.0–36.0)
MCV: 81.1 fL (ref 78.0–100.0)
Platelets: 211 10*3/uL (ref 150–400)
RBC: 3.8 MIL/uL — AB (ref 3.87–5.11)
RDW: 15.9 % — ABNORMAL HIGH (ref 11.5–15.5)
WBC: 14.4 10*3/uL — ABNORMAL HIGH (ref 4.0–10.5)

## 2014-09-15 LAB — RPR: RPR Ser Ql: NONREACTIVE

## 2014-09-15 MED ORDER — METHYLERGONOVINE MALEATE 0.2 MG PO TABS: 0.2000 mg | ORAL_TABLET | ORAL | Status: DC | PRN

## 2014-09-15 MED ORDER — MAGNESIUM HYDROXIDE 400 MG/5ML PO SUSP: 30.0000 mL | ORAL | Status: DC | PRN

## 2014-09-15 MED ORDER — ACETAMINOPHEN 325 MG PO TABS: 650.0000 mg | ORAL_TABLET | ORAL | Status: DC | PRN

## 2014-09-15 MED ORDER — FERROUS SULFATE 325 (65 FE) MG PO TABS
325.0000 mg | ORAL_TABLET | Freq: Two times a day (BID) | ORAL | Status: DC
  Administered 2014-09-15 (×2): 325 mg via ORAL
  Filled 2014-09-15 (×2): qty 1

## 2014-09-15 MED ORDER — SENNOSIDES-DOCUSATE SODIUM 8.6-50 MG PO TABS
2.0000 | ORAL_TABLET | ORAL | Status: DC
  Administered 2014-09-15 – 2014-09-16 (×2): 2 via ORAL
  Filled 2014-09-15 (×2): qty 2

## 2014-09-15 MED ORDER — SODIUM CHLORIDE 0.9 % IJ SOLN: 3.0000 mL | Freq: Two times a day (BID) | INTRAMUSCULAR | Status: DC

## 2014-09-15 MED ORDER — TETANUS-DIPHTH-ACELL PERTUSSIS 5-2.5-18.5 LF-MCG/0.5 IM SUSP: 0.5000 mL | Freq: Once | INTRAMUSCULAR | Status: DC

## 2014-09-15 MED ORDER — LANOLIN HYDROUS EX OINT: TOPICAL_OINTMENT | CUTANEOUS | Status: DC | PRN

## 2014-09-15 MED ORDER — DIBUCAINE 1 % RE OINT: 1.0000 "application " | TOPICAL_OINTMENT | RECTAL | Status: DC | PRN

## 2014-09-15 MED ORDER — SIMETHICONE 80 MG PO CHEW: 80.0000 mg | CHEWABLE_TABLET | ORAL | Status: DC | PRN

## 2014-09-15 MED ORDER — DIPHENHYDRAMINE HCL 25 MG PO CAPS: 25.0000 mg | ORAL_CAPSULE | Freq: Four times a day (QID) | ORAL | Status: DC | PRN

## 2014-09-15 MED ORDER — SODIUM CHLORIDE 0.9 % IV SOLN: 250.0000 mL | INTRAVENOUS | Status: DC | PRN

## 2014-09-15 MED ORDER — ONDANSETRON HCL 4 MG PO TABS: 4.0000 mg | ORAL_TABLET | ORAL | Status: DC | PRN

## 2014-09-15 MED ORDER — ZOLPIDEM TARTRATE 5 MG PO TABS: 5.0000 mg | ORAL_TABLET | Freq: Every evening | ORAL | Status: DC | PRN

## 2014-09-15 MED ORDER — SODIUM CHLORIDE 0.9 % IJ SOLN: 3.0000 mL | INTRAMUSCULAR | Status: DC | PRN

## 2014-09-15 MED ORDER — BENZOCAINE-MENTHOL 20-0.5 % EX AERO: 1.0000 "application " | INHALATION_SPRAY | CUTANEOUS | Status: DC | PRN

## 2014-09-15 MED ORDER — ONDANSETRON HCL 4 MG/2ML IJ SOLN: 4.0000 mg | INTRAMUSCULAR | Status: DC | PRN

## 2014-09-15 MED ORDER — METHYLERGONOVINE MALEATE 0.2 MG/ML IJ SOLN: 0.2000 mg | INTRAMUSCULAR | Status: DC | PRN

## 2014-09-15 MED ORDER — MEASLES, MUMPS & RUBELLA VAC ~~LOC~~ INJ
0.5000 mL | INJECTION | Freq: Once | SUBCUTANEOUS | Status: DC
  Filled 2014-09-15: qty 0.5

## 2014-09-15 MED ORDER — IBUPROFEN 800 MG PO TABS
800.0000 mg | ORAL_TABLET | Freq: Three times a day (TID) | ORAL | Status: DC
  Administered 2014-09-15 – 2014-09-16 (×5): 800 mg via ORAL
  Filled 2014-09-15 (×5): qty 1

## 2014-09-15 MED ORDER — OXYCODONE-ACETAMINOPHEN 5-325 MG PO TABS
1.0000 | ORAL_TABLET | ORAL | Status: DC | PRN
  Administered 2014-09-15 (×2): 1 via ORAL
  Filled 2014-09-15 (×2): qty 1

## 2014-09-15 MED ORDER — WITCH HAZEL-GLYCERIN EX PADS: 1.0000 "application " | MEDICATED_PAD | CUTANEOUS | Status: DC | PRN

## 2014-09-15 MED ORDER — PRENATAL MULTIVITAMIN CH
1.0000 | ORAL_TABLET | Freq: Every day | ORAL | Status: DC
  Administered 2014-09-15 – 2014-09-16 (×2): 1 via ORAL
  Filled 2014-09-15 (×2): qty 1

## 2014-09-15 MED ORDER — OXYCODONE-ACETAMINOPHEN 5-325 MG PO TABS
2.0000 | ORAL_TABLET | ORAL | Status: DC | PRN
  Administered 2014-09-16: 2 via ORAL
  Filled 2014-09-15: qty 2

## 2014-09-15 NOTE — Progress Notes (Signed)
Patient is eating, ambulating, voiding.  Pain control is good.  Appropriate lochia, no complaints.  Filed Vitals:   09/14/14 1528 09/15/14 0010 09/15/14 0115 09/15/14 0500  BP:  118/73 121/61 100/58  Pulse:  76 83 9  Temp:  97.6 F (36.4 C) 98.6 F (37 C) 98.9 F (37.2 C)  TempSrc:  Oral Oral Oral  Resp:  20 20 18   Height:      Weight:      SpO2: 99%       Fundus firm Perineum without swelling. No Ct  Lab Results  Component Value Date   WBC 14.4* 09/15/2014   HGB 9.7* 09/15/2014   HCT 30.8* 09/15/2014   MCV 81.1 09/15/2014   PLT 211 09/15/2014    --/--/O POS (05/03 0950)  A/P Post partum day 1.  Routine care.  Per RN, baby medicaid pending status - will need circ in office, mother aware - she will call office to schedule  Expect d/c 5/5.    Philip AspenALLAHAN, Maitland Lesiak

## 2014-09-15 NOTE — Lactation Note (Addendum)
This note was copied from the chart of Cathy Maeby Murthy. Lactation Consultation Note  Patient Name: Cathy Rosana BergerMikia Mellin RUEAV'WToday's Date: 09/15/2014 Reason for consult: Initial assessment (see LC note )  Baby is 15 hours, and has breast fed x5 10 -30 mins , and 1- 5 min feeding. Per mom request on nights had the RN feed a bottle 7 ml. 2 wets , 2 stools.  Per mom I got frustrated due to trying to breast feed in the football position  And I was feeling pain in my wrist due to an IV stick. The RN showed me  Different positions and the cross cradle seemed more comfortable. LC reviewed breastfeeding teaching and documented in NF teaching sheet. Discussed supply and demand , importance of depth with latch, How to prevent sore ness.  Per mom baby recently breast fed, for 15 mins with swallows. Mother informed of post-discharge support and given phone number to the lactation department,  including services for phone call assistance; out-patient appointments; and breastfeeding support  group. List of other breastfeeding resources in the community given in the handout. Encouraged mother  to call for problems or concerns related to breastfeeding. LC encouraged mom to page with feeding cues.    Maternal Data Does the patient have breastfeeding experience prior to this delivery?: Yes  Feeding Feeding Type:  (baby recently breast fed ) Length of feed: 15 min (per mom )  LATCH Score/Interventions                      Lactation Tools Discussed/Used Tools: Pump Breast pump type: Manual WIC Program: Yes (per mom [place] [facility] ) Pump Review: Setup, frequency, and cleaning (LC reviewed set up of hand pump and how to clean ) Initiated by:: MAI  Date initiated:: 09/15/14   Consult Status Date: 09/15/14 Follow-up type: In-patient    [name], [name] 09/15/2014, 1:52 PM

## 2014-09-15 NOTE — Anesthesia Postprocedure Evaluation (Signed)
  Anesthesia Post-op Note  Patient: [name]  Procedure(s) Performed: * No procedures listed *  Patient Location: Mother/Baby  Anesthesia Type:Epidural  Level of Consciousness: awake, alert , oriented and patient cooperative  Airway and Oxygen Therapy: Patient Spontanous Breathing  Post-op Pain: none  Post-op Assessment: Post-op Vital signs reviewed, Patient's Cardiovascular Status Stable, Respiratory Function Stable, Patent Airway, No headache, No backache, No residual numbness and No residual motor weakness  Post-op Vital Signs: Reviewed and stable  Last Vitals:  Filed Vitals:   09/15/14 0500  BP: 100/58  Pulse: 9  Temp: 37.2 C  Resp: 18    Complications: No apparent anesthesia complications

## 2014-09-15 NOTE — Progress Notes (Signed)
CLINICAL SOCIAL WORK MATERNAL/CHILD NOTE  Patient Details  Name: [name] Belair MRN: [id number] Date of Birth: 09/14/2016  Date:  09/15/2014  Clinical Social Worker Initiating Note:  Loleta BooksSarah Ivonne [place], LCSW Date/ Time Initiated:  09/15/14/1300     Legal Guardian:  Parents: Rosana Macias Babler and Tyson BabinskiNathan Little   Need for Interpreter:  None   Date of Referral:  09/14/14     Reason for Referral:  History of depression and anxiety  Referral Source[facility]  [facility]   Address:  [address] [place], [place] [address]  Phone number:  [phone]   Household Members:  FOB and daugther, Colletta Marylandevaeh (11/04/09)  Natural Supports (not living in the home):  Immediate Family- MOB stated that her family lives in [place] and has limited support in [place]. She stated that her family is available via telephone for emotional support.   Professional Supports: None   Employment: Full-time   Type of Work: N/A  Education:  N/A  Architectinancial Resources:  [employer]   Other Resources:  [employer]   Cultural/Religious Considerations Which May Impact Care:  None reported  Strengths:  Ability to meet basic needs , Home prepared for child , Pediatrician chosen    Risk Factors/Current Problems:   1) Mental Health Concerns: MOB presents with history of anxiety/depression since adolescence. She reported increase in symptoms during the pregnancy, including decreased ability to sleep, increase in fatigue, and decreased interest in activities of daily living. She also endorsed racing thoughts, chest pain, and panic attacks during the pregnancy.  MOB presents with heightened risk of ongoing mental health concerns due to prior history and symptoms during the pregnancy.   Cognitive State:  Able to Concentrate , Alert , Goal Oriented , Linear Thinking    Mood/Affect:  Bright , Calm , Interested    CSW Assessment:  MOB presented as easily engaged and receptive to the visit. She displayed a full range in affect and was in a  pleasant mood.  MOB was noted to be attending to and bonding with the infant during the entire visit.  MOB appeared forthcoming about her mental health as she openly discussed her mental health during this pregnancy.   Per MOB, she has had a history of anxiety and depression since adolescence and shared belief that she has learned to cope with symptoms through the years; however, she struggled to identify specific interventions that assist her to self-regulate.  MOB shared that during the pregnancy, she had significant nausea and physical complaints which she believes is connected to her increase in depression and anxiety. She shared that she often spent days in bed and felt isolated/alone. She discussed that it was often difficult for her to remain positive and hopeful when she felt "terrible".  She stated that she also had increased stress in life since she had concerns about her ability to pay the mortgage and other household costs since she had decreased her work hours.  MOB stated that this stress is recently been reduced since she was able to find a program to help her with her mortgage. She also reported that she saved her tax return which has allowed her to have money during the pregnancy and prepare for the infant's arrival.    MOB stated that she has been open with her OB during the pregnancy with how she feels, and disclosed that the OB recommended Zoloft and therapy.  MOB stated that she chose to not start Zoloft out of fear of the infant going through withdrawal.  She stated that she was also prescribed Xanax, but was also concerned about how the medication would impact the infant.  MOB shared that she is open to therapy.  CSW continued to explore with MOB how her depression and anxiety have impacted her life, her family, and potential outcomes if depression and anxiety continue or intensify.  MOB presented with insight on how depression/anxiety have negatively impacted her ability to be the mother  she wants to be. She stated that she does not like how she feels when she is experiencing symptoms.  MOB smiled as she reflected upon how life may look if she had less intense symptoms, and stated that it would be a "relief".  MOB stated that she is receptive to beginning therapy since she believes that there is a lot to gain.  She reported that the biggest barrier to accessing a therapist would be time/schedules, but shared that she is motivated by the potential outcomes.  MOB stated that she is more receptive and open to medication management at this time as long as the medications are safe to take while breastfeeding.  CSW recommended consulting with LC in order to ask her questions.   CSW continued to assist the MOB to develop a plan to address her mental health in the postpartum period. MOB verbalized understanding that she is at a heightened risk of developing postpartum depression given her mental health history and her symptoms during the pregnancy. She stated that she is "feeling better" and is not concerned, but recognized importance of engaging in daily self-care. She stated that she also intends to utilize her referrals for therapists that she has already received.  MOB agreed to contact her OB if she notes increase in symptoms, she does not like the impact of her symptoms on her daily life, and her normative ways of coping are not helpful in symptoms reduction.   MOB denied additional questions, concerns, or needs at this time. She expressed appreciation for the visit and support and agreed to contact CSW if needs arise.   CSW Plan/Description:  1)Patient/Family Education: Postpartum depression 2)Information/Referral to [facility] Resources: MOB reported that she already has referrals for therapists. She stated that she will contact her OB if she needs to discuss medications.  2)No Further Intervention Required/No Barriers to Discharge    Kelby FamVenning, Camilah Spillman N, LCSW 09/15/2014, 2:16 PM

## 2014-09-16 MED ORDER — IBUPROFEN 800 MG PO TABS: 800.0000 mg | ORAL_TABLET | Freq: Three times a day (TID) | ORAL | Status: AC | PRN

## 2014-09-16 MED ORDER — OXYCODONE-ACETAMINOPHEN 5-325 MG PO TABS: 1.0000 | ORAL_TABLET | ORAL | Status: AC | PRN

## 2014-09-16 NOTE — Discharge Summary (Signed)
Obstetric Discharge Summary Reason for Admission: induction of labor Prenatal Procedures: NST and ultrasound Intrapartum Procedures: spontaneous vaginal delivery Postpartum Procedures: none Complications-Operative and Postpartum: none HEMOGLOBIN  Date Value Ref Range Status  09/15/2014 9.7* 12.0 - 15.0 g/dL Final   HCT  Date Value Ref Range Status  09/15/2014 30.8* 36.0 - 46.0 % Final    Physical Exam:  General: alert, cooperative and no distress Lochia: appropriate Uterine Fundus: firm perineum: healing well DVT Evaluation: No evidence of DVT seen on physical exam. Negative Homan's sign. No cords or calf tenderness.  Discharge Diagnoses: Term Pregnancy-delivered  Discharge Information: Date: 09/16/2014 Activity: pelvic rest Diet: routine Medications: PNV, Ibuprofen and Percocet Condition: stable Instructions: refer to practice specific booklet Discharge to: home   Newborn Data: Live born female  Birth Weight: 7 lb 8.3 oz (3410 g) APGAR: 8, 9  Home with mother.  Cathy HartINN, [name] STACIA 09/16/2014, 8:40 AM

## 2014-09-16 NOTE — Lactation Note (Addendum)
This note was copied from the chart of [name]. Lactation Consultation Note  Patient Name: Cathy Rosana BergerMikia Johanson ZOXWR'UToday's Date: 09/16/2014 Reason for consult: Follow-up assessment  Baby 37 hours of life. Mom states that she has definitely switched to bottle-feeding formula. Provided anticipatory guidance and engorgement prevention/treatment. Mom states that she used cabbage leaves when her milk came in with her first baby. Mom aware of LC phone line assistance after D/C. Maternal Data    Feeding Feeding Type: Bottle Fed - Formula Nipple Type: Slow - flow  LATCH Score/Interventions                      Lactation Tools Discussed/Used     Consult Status Consult Status: Complete    Norell Brisbin 09/16/2014, 11:43 AM

## 2014-10-20 ENCOUNTER — Other Ambulatory Visit: Payer: Self-pay | Admitting: Obstetrics and Gynecology

## 2015-08-10 ENCOUNTER — Other Ambulatory Visit: Payer: Self-pay | Admitting: Obstetrics and Gynecology

## 2023-05-13 ENCOUNTER — Encounter (HOSPITAL_BASED_OUTPATIENT_CLINIC_OR_DEPARTMENT_OTHER): Payer: Self-pay

## 2023-05-13 ENCOUNTER — Other Ambulatory Visit: Payer: Self-pay

## 2023-05-13 ENCOUNTER — Emergency Department (HOSPITAL_BASED_OUTPATIENT_CLINIC_OR_DEPARTMENT_OTHER): Payer: BC Managed Care – PPO

## 2023-05-13 ENCOUNTER — Emergency Department (HOSPITAL_BASED_OUTPATIENT_CLINIC_OR_DEPARTMENT_OTHER)
Admission: EM | Admit: 2023-05-13 | Discharge: 2023-05-13 | Disposition: A | Discharge status: Home / Self Care | Payer: BC Managed Care – PPO | Attending: Emergency Medicine | Admitting: Emergency Medicine

## 2023-05-13 DIAGNOSIS — M25512 Pain in left shoulder: Secondary | ICD-10-CM | POA: Diagnosis not present

## 2023-05-13 DIAGNOSIS — R109 Unspecified abdominal pain: Secondary | ICD-10-CM | POA: Diagnosis not present

## 2023-05-13 DIAGNOSIS — M542 Cervicalgia: Secondary | ICD-10-CM | POA: Diagnosis not present

## 2023-05-13 DIAGNOSIS — O209 Hemorrhage in early pregnancy, unspecified: Secondary | ICD-10-CM | POA: Insufficient documentation

## 2023-05-13 DIAGNOSIS — Z5321 Procedure and treatment not carried out due to patient leaving prior to being seen by health care provider: Secondary | ICD-10-CM | POA: Insufficient documentation

## 2023-05-13 DIAGNOSIS — Z3A00 Weeks of gestation of pregnancy not specified: Secondary | ICD-10-CM | POA: Insufficient documentation

## 2023-05-13 LAB — BASIC METABOLIC PANEL
Anion gap: 6 (ref 5–15)
BUN: 14 mg/dL (ref 6–20)
CO2: 21 mmol/L — ABNORMAL LOW (ref 22–32)
Calcium: 8.7 mg/dL — ABNORMAL LOW (ref 8.9–10.3)
Chloride: 110 mmol/L (ref 98–111)
Creatinine, Ser: 0.65 mg/dL (ref 0.44–1.00)
GFR, Estimated: >60 mL/min (ref >60)
Glucose, Bld: 87 mg/dL (ref 70–99)
Potassium: 3.9 mmol/L (ref 3.5–5.1)
Sodium: 137 mmol/L (ref 135–145)

## 2023-05-13 LAB — CBC WITH DIFFERENTIAL/PLATELET
Abs Immature Granulocytes: 0.06 10*3/uL (ref 0.00–0.07)
Basophils Absolute: 0.1 10*3/uL (ref 0.0–0.1)
Basophils Relative: 0 %
Eosinophils Absolute: 0 10*3/uL (ref 0.0–0.5)
Eosinophils Relative: 0 %
HCT: 36.1 % (ref 36.0–46.0)
Hemoglobin: 11.3 g/dL — ABNORMAL LOW (ref 12.0–15.0)
Immature Granulocytes: 0 %
Lymphocytes Relative: 24 %
Lymphs Abs: 3.7 10*3/uL (ref 0.7–4.0)
MCH: 26.3 pg (ref 26.0–34.0)
MCHC: 31.3 g/dL (ref 30.0–36.0)
MCV: 84 fL (ref 80.0–100.0)
Monocytes Absolute: 0.6 10*3/uL (ref 0.1–1.0)
Monocytes Relative: 4 %
Neutro Abs: 11 10*3/uL — ABNORMAL HIGH (ref 1.7–7.7)
Neutrophils Relative %: 72 %
Platelets: 279 10*3/uL (ref 150–400)
RBC: 4.3 MIL/uL (ref 3.87–5.11)
RDW: 14.7 % (ref 11.5–15.5)
WBC: 15.5 10*3/uL — ABNORMAL HIGH (ref 4.0–10.5)
nRBC: 0 % (ref 0.0–0.2)

## 2023-05-13 LAB — PREGNANCY, URINE: Preg Test, Ur: POSITIVE — AB

## 2023-05-13 LAB — ABO/RH: ABO/RH(D): O POS

## 2023-05-13 LAB — HCG, QUANTITATIVE, PREGNANCY: hCG, Beta Chain, Quant, S: 2466 m[IU]/mL — ABNORMAL HIGH (ref <5)

## 2023-05-13 NOTE — ED Triage Notes (Signed)
Pt arrives with c/o vaginal bleeding that started today. Pt had a positive home pregnancy test a couple of  days ago and then had lab work done at PCP today. Pt reports left sided flank and shoulder/ neck pain and ABD cramping. Pt unsure of how far along she is.

## 2023-05-13 NOTE — ED Notes (Signed)
Patient called twice for examination room with no response.

## 2023-05-13 NOTE — ED Notes (Signed)
Called a third time for examination room. No answer and not visualized in waiting areas. To be discharged accordingly.
